# Patient Record
Sex: Female | Born: 1985 | Race: White | Hispanic: No | Marital: Single | Smoking: Current every day smoker
Health system: Southern US, Community
[De-identification: ages and names within clinical notes are randomized; demographics above are authoritative.]

## PROBLEM LIST (undated history)

## (undated) DIAGNOSIS — B1920 Unspecified viral hepatitis C without hepatic coma: Secondary | ICD-10-CM

## (undated) DIAGNOSIS — F429 Obsessive-compulsive disorder, unspecified: Secondary | ICD-10-CM

---

## 2012-12-09 DIAGNOSIS — F39 Unspecified mood [affective] disorder: Secondary | ICD-10-CM | POA: Insufficient documentation

## 2015-08-02 ENCOUNTER — Emergency Department (HOSPITAL_BASED_OUTPATIENT_CLINIC_OR_DEPARTMENT_OTHER): Payer: Self-pay

## 2015-08-02 ENCOUNTER — Encounter (HOSPITAL_BASED_OUTPATIENT_CLINIC_OR_DEPARTMENT_OTHER): Payer: Self-pay

## 2015-08-02 ENCOUNTER — Emergency Department (HOSPITAL_BASED_OUTPATIENT_CLINIC_OR_DEPARTMENT_OTHER)
Admission: EM | Admit: 2015-08-02 | Discharge: 2015-08-02 | Disposition: A | Discharge status: Home / Self Care | Payer: Self-pay | Attending: Emergency Medicine | Admitting: Emergency Medicine

## 2015-08-02 DIAGNOSIS — K5900 Constipation, unspecified: Secondary | ICD-10-CM | POA: Insufficient documentation

## 2015-08-02 DIAGNOSIS — F17200 Nicotine dependence, unspecified, uncomplicated: Secondary | ICD-10-CM | POA: Insufficient documentation

## 2015-08-02 HISTORY — DX: Unspecified viral hepatitis C without hepatic coma: B19.20

## 2015-08-02 LAB — PREGNANCY, URINE: Preg Test, Ur: NEGATIVE

## 2015-08-02 MED ORDER — POLYETHYLENE GLYCOL 3350 17 G PO PACK: 17.0000 g | PACK | Freq: Two times a day (BID) | ORAL | Status: DC

## 2015-08-02 MED ORDER — SENNOSIDES 15 MG PO CHEW: 2.0000 | CHEWABLE_TABLET | Freq: Every day | ORAL | Status: DC

## 2015-08-02 NOTE — ED Provider Notes (Signed)
CSN: [id number]     Arrival date & time 08/02/15  1707 History   First MD Initiated Contact with Patient 08/02/15 1723     Chief Complaint  Patient presents with  . Constipation      Patient is a 30 y.o. female presenting with constipation. The history is provided by the patient.  Constipation Severity:  Moderate Associated symptoms: abdominal pain and vomiting   Associated symptoms: no back pain, no diarrhea, no fever and no nausea   Patient presents with constipation for a week. States it hurts in her lower abdomen. 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 States that whenever she withdrawals she tends to get constipated. States she gets "compacted". No fevers. States she also was nauseous. No dysuria. Denies possibly pregnancy and states her tubes have been cut out. States she has had miralax and other laxatives without relief. States she's had magnesium citrate 3 times. States she is at [facility] and they do not allow her to have her regular routine. She states her abdomen is distended she's put on 20 pounds.   Past Medical History  Diagnosis Date  . Hepatitis C    History reviewed. No pertinent past surgical history. No family history on file. Social History  Substance Use Topics  . Smoking status: Current Every Day Smoker  . Smokeless tobacco: None  . Alcohol Use: None   OB History    No data available     Review of Systems  Constitutional: Negative for fever, chills, activity change and appetite change.  Eyes: Negative for pain.  Respiratory: Negative for chest tightness and shortness of breath.   Cardiovascular: Negative for chest pain and leg swelling.  Gastrointestinal: Positive for vomiting, abdominal pain and constipation. Negative for nausea and diarrhea.  Genitourinary: Negative for flank pain.  Musculoskeletal: Negative for back pain and neck stiffness.  Skin: Negative for rash.  Neurological: Negative for weakness,  numbness and headaches.  Psychiatric/Behavioral: Negative for behavioral problems.      Allergies  Review of patient's allergies indicates no known allergies.  Home Medications   Prior to Admission medications   Medication Sig Start Date End Date Taking? Authorizing Provider  polyethylene glycol (MIRALAX) packet Take 17 g by mouth 2 (two) times daily. 08/02/15   Benjiman CoreNathan Rishik Tubby, MD  Sennosides 15 MG CHEW Chew 2 tablets (30 mg total) by mouth daily. Until bowel movement 08/02/15   Benjiman CoreNathan Tyrone Balash, MD   BP 119/71 mmHg  Pulse 68  Temp(Src) 98 F (36.7 C) (Oral)  Resp 18  Ht 5\' 8"  (1.727 m)  Wt 191 lb (86.637 kg)  BMI 29.05 kg/m2  SpO2 100%  LMP 07/12/2015 Physical Exam  Constitutional: She appears well-nourished.  HENT:  Head: Atraumatic.  Neck: Neck supple.  Cardiovascular: Normal rate.   Pulmonary/Chest: Effort normal.  Abdominal: There is tenderness.  Tenderness to lower abdomen, particularly left lower quadrant without rebound or guarding.  Musculoskeletal: Normal range of motion.  Neurological: She is alert.  Skin: Skin is warm.    ED Course  Procedures (including critical care time) Labs Review Labs Reviewed  PREGNANCY, URINE    Imaging Review Dg Abd 2 Views  08/02/2015  CLINICAL DATA:  Constipation x 6 days with bloating and lower abdominal pain. Pt unable to remove nipple piercings. Shielded UPREG negative EXAM: ABDOMEN - 2 VIEW COMPARISON:  None. FINDINGS: Visualized lung bases clear. No free air. Small bowel is nondistended. Moderate amount of fecal material throughout  the colon, rectum decompressed. Left pelvic phleboliths. Regional bones unremarkable. IMPRESSION: Nonobstructive bowel gas pattern with moderate colonic fecal material. Electronically Signed   By: Corlis Leak M.D.   On: 08/02/2015 18:43   I have personally reviewed and evaluated these images and lab results as part of my medical decision-making.   EKG Interpretation None      MDM    Final diagnoses:  Constipation    Patient with likely constipation. History of same. X-ray shows constipation. Will discharge with some medicines to help clear up.    Benjiman Core, MD 08/02/15 2257

## 2015-08-02 NOTE — ED Notes (Signed)
Pt reports last BM one week ago. Pt states [facility] is [facility]. Pt states she normally experiences constipation during detox and [facility] will not allow her to take stool softeners without a rx. Pt reports low mid abd pain, denies N/V

## 2015-08-02 NOTE — ED Notes (Signed)
MD at bedside. 

## 2015-08-02 NOTE — ED Notes (Signed)
Patient here with constipation x 1 week. Has had mag citrate x 3 bottles this week at [facility] with no relief, hx of same. Nausea with any solid intake. They have given her miralax as well

## 2015-08-02 NOTE — Discharge Instructions (Signed)

## 2015-08-27 ENCOUNTER — Emergency Department (HOSPITAL_BASED_OUTPATIENT_CLINIC_OR_DEPARTMENT_OTHER)
Admission: EM | Admit: 2015-08-27 | Discharge: 2015-08-27 | Disposition: A | Discharge status: Home / Self Care | Payer: Self-pay | Attending: Emergency Medicine | Admitting: Emergency Medicine

## 2015-08-27 ENCOUNTER — Encounter (HOSPITAL_BASED_OUTPATIENT_CLINIC_OR_DEPARTMENT_OTHER): Payer: Self-pay | Admitting: *Deleted

## 2015-08-27 DIAGNOSIS — F17200 Nicotine dependence, unspecified, uncomplicated: Secondary | ICD-10-CM | POA: Insufficient documentation

## 2015-08-27 DIAGNOSIS — Z79899 Other long term (current) drug therapy: Secondary | ICD-10-CM | POA: Insufficient documentation

## 2015-08-27 DIAGNOSIS — L409 Psoriasis, unspecified: Secondary | ICD-10-CM | POA: Insufficient documentation

## 2015-08-27 LAB — PREGNANCY, URINE: PREG TEST UR: NEGATIVE

## 2015-08-27 MED ORDER — PREDNISONE 50 MG PO TABS
60.0000 mg | ORAL_TABLET | Freq: Once | ORAL | Status: AC
  Administered 2015-08-27: 60 mg via ORAL
  Filled 2015-08-27: qty 1

## 2015-08-27 MED ORDER — LORATADINE 10 MG PO TABS
10.0000 mg | ORAL_TABLET | Freq: Once | ORAL | Status: AC
  Administered 2015-08-27: 10 mg via ORAL
  Filled 2015-08-27: qty 1

## 2015-08-27 MED ORDER — PREDNISONE 20 MG PO TABS: ORAL_TABLET | ORAL | Status: DC

## 2015-08-27 MED ORDER — LORATADINE 10 MG PO TABS: 10.0000 mg | ORAL_TABLET | Freq: Every day | ORAL | Status: DC

## 2015-08-27 NOTE — Discharge Instructions (Signed)
Psoriasis Psoriasis is a long-term (chronic) condition of skin inflammation. It occurs because your immune system causes skin cells to form too quickly. As a result, too many skin cells grow and create raised, red patches (plaques) that look silvery on your skin. Plaques may appear anywhere on your body. They can be any size or shape. Psoriasis can come and go. The condition varies from mild to very severe. It cannot be passed from one person to another (not contagious).  CAUSES  The cause of psoriasis is not known, but certain factors can make the condition worse. These include:   Damage or trauma to the skin, such as cuts, scrapes, sunburn, and dryness.  Lack of sunlight.  Certain medicines.  Alcohol.  Tobacco use.  Stress.  Infections caused by bacteria or viruses. RISK FACTORS This condition is more likely to develop in:  People with a family history of psoriasis.  People who are Caucasian.  People who are between the ages of 15-30 and 50-60 years old. SYMPTOMS  There are five different types of psoriasis. You can have more than one type of psoriasis during your life. Types are:   Plaque.  Guttate.  Inverse.  Pustular.  Erythrodermic. Each type of psoriasis has different symptoms.   Plaque psoriasis symptoms include red, raised plaques with a silvery white coating (scale). These plaques may be itchy. Your nails may be pitted and crumbly or fall off.  Guttate psoriasis symptoms include small red spots that often show up on your trunk, arms, and legs. These spots may develop after you have been sick, especially with strep throat.  Inverse psoriasis symptoms include plaques in your underarm area, under your breasts, or on your genitals, groin, or buttocks.  Pustular psoriasis symptoms include pus-filled bumps that are painful, red, and swollen on the palms of your hands or the soles of your feet. You also may feel exhausted, feverish, weak, or have no  appetite.  Erythrodermic psoriasis symptoms include bright red skin that may look burned. You may have a fast heartbeat and a body temperature that is too high or too low. You may be itchy or in pain. DIAGNOSIS  Your health care provider may suspect psoriasis based on your symptoms and family history. Your health care provider will also do a physical exam. This may include a procedure to remove a tissue sample (biopsy) for testing. You may also be referred to a health care provider who specializes in skin diseases (dermatologist).  TREATMENT There is no cure for this condition, but treatment can help manage it. Goals of treatment include:   Helping your skin heal.  Reducing itching and inflammation.  Slowing the growth of new skin cells.  Helping your immune system respond better to your skin. Treatment varies, depending on the severity of your condition. Treatment may include:   Creams or ointments.  Ultraviolet ray exposure (light therapy). This may include natural sunlight or light therapy in a medical office.  Medicines (systemic therapy). These medicines can help your body better manage skin cell turnover and inflammation. They may be used along with light therapy or ointments. You may also get antibiotic medicines if you have an infection. HOME CARE INSTRUCTIONS Skin Care  Moisturize your skin as needed. Only use moisturizers that have been approved by your health care provider.   Apply cool compresses to the affected areas.   Do not scratch your skin.  Lifestyle  Do not use tobacco products. This includes cigarettes, chewing tobacco, and e-cigarettes. If you   need help quitting, ask your health care provider.  Drink little or no alcohol.   Try techniques for stress reduction, such as meditation or yoga.  Get exposure to the sun as told by your health care provider. Do not get sunburned.   Consider joining a psoriasis support group.  Medicines  Take or use  over-the-counter and prescription medicines only as told by your health care provider.  If you were prescribed an antibiotic, take or use it as told by your health care provider. Do not stop taking the antibiotic even if your condition starts to improve. General Instructions  Keep a journal to help track what triggers an outbreak. Try to avoid any triggers.   See a counselor or social worker if feelings of sadness, frustration, and hopelessness about your condition are interfering with your work and relationships.  Keep all follow-up visits as told by your health care provider. This is important. SEEK MEDICAL CARE IF:  Your pain gets worse.  You have increasing redness or warmth in the affected areas.   You have new or worsening pain or stiffness in your joints.  Your nails start to break easily or pull away from the nail bed.   You have a fever.   You feel depressed.   This information is not intended to replace advice given to you by your health care provider. Make sure you discuss any questions you have with your health care provider.   Document Released: 01/26/2000 Document Revised: 10/19/2014 Document Reviewed: 06/15/2014 Elsevier Interactive Patient Education 2016 [employer]  

## 2015-08-27 NOTE — ED Provider Notes (Signed)
CSN: [id number]     Arrival date & time 08/27/15  2054 History  By signing my name below, I, Levon Hedger, attest that this documentation has been prepared under the direction and in the presence of No att. providers found . Electronically Signed: Levon Hedger, Scribe. 08/27/2015. 10:57 PM.   Chief Complaint  Patient presents with  . Allergic Reaction    Patient is a 30 y.o. female presenting with rash. The history is provided by the patient. No language interpreter was used.  Rash Location:  Head/neck and face Head/neck rash location:  Scalp Facial rash location:  Face Quality: itchiness   Quality: not draining   Severity:  Mild Onset quality:  Gradual Timing:  Constant Progression:  Worsening Chronicity:  Recurrent Context: not hot tub use   Relieved by:  Nothing Worsened by:  Nothing tried Ineffective treatments:  None tried Associated symptoms: no fever, no induration, no throat swelling and no tongue swelling     HPI Comments:  Leslie Dyer is a 30 y.o. female with PMHx of OCD who presents to the Emergency Department complaining of sudden onset lesions on her face and scalp on Friday. P tis a [facility] pt. She recently started on prozac and has hx of previous allergic reaction to it. Pt is "a picker" and is picking the blisters. No alleviating factors noted. No other complaints at this time.    Past Medical History  Diagnosis Date  . Hepatitis C    History reviewed. No pertinent past surgical history. History reviewed. No pertinent family history. Social History  Substance Use Topics  . Smoking status: Current Every Day Smoker  . Smokeless tobacco: None  . Alcohol Use: No   OB History    No data available     Review of Systems  Constitutional: Negative for fever.  Skin: Positive for rash.  All other systems reviewed and are negative.    Allergies  Review of patient's allergies indicates no known allergies.  Home Medications   Prior to Admission  medications   Medication Sig Start Date End Date Taking? Authorizing Provider  FLUoxetine (PROZAC) 10 MG capsule Take 10 mg by mouth daily.   Yes Historical Provider, MD  polyethylene glycol (MIRALAX) packet Take 17 g by mouth 2 (two) times daily. 08/02/15   Benjiman Core, MD  Sennosides 15 MG CHEW Chew 2 tablets (30 mg total) by mouth daily. Until bowel movement 08/02/15   Benjiman Core, MD   BP 118/87 mmHg  Pulse 79  Temp(Src) 98.1 F (36.7 C) (Oral)  Resp 18  Ht  (1.753 m)  Wt 197 lb (89.359 kg)  BMI 29.08 kg/m2  SpO2 99%  LMP 07/10/2015 Physical Exam  Constitutional: She is oriented to person, place, and time. She appears well-developed and well-nourished. No distress.  HENT:  Head: Normocephalic and atraumatic.  Mouth/Throat: Oropharynx is clear and moist. No oropharyngeal exudate.  Scabs of the face.  Psoriatic lesions of the scalp  Eyes: Conjunctivae are normal. Pupils are equal, round, and reactive to light.  Neck: Normal range of motion. Neck supple. No JVD present.  Trachea midline No bruit  Cardiovascular: Normal rate, regular rhythm and normal heart sounds.   Pulmonary/Chest: Effort normal and breath sounds normal. No stridor. No respiratory distress. She has no wheezes. She has no rales.  Abdominal: Soft. Bowel sounds are normal. She exhibits no distension. There is no tenderness. There is no rebound and no guarding.  Neurological: She is alert and oriented to person, place,  and time. She has normal reflexes.  Skin: Skin is warm and dry.  Psychiatric: She has a normal mood and affect. Her behavior is normal.  Nursing note and vitals reviewed.   ED Course  Procedures  DIAGNOSTIC STUDIES:  Oxygen Saturation is 99% on RA, normal by my interpretation.    COORDINATION OF CARE:  11:07 PM Will order preDiscussed treatment plan with pt at bedside and pt agreed to plan.  Labs Review Labs Reviewed  PREGNANCY, URINE    Imaging Review No results found. I  have personally reviewed and evaluated these images and lab results as part of my medical decision-making.   EKG Interpretation None      MDM   Final diagnoses:  None   Filed Vitals:   08/27/15 2106 08/27/15 2328  BP: 118/87 120/84  Pulse: 79 78  Temp: 98.1 F (36.7 C) 98.2 F (36.8 C)  Resp: 18 18    The facial lesions are not the same as the scalp lesions.  The scalp lesions are consistent with psoriasis.  It is unclear what the facial lesions were, they are scabs at this point there are 4 lesions approximately 5 mm, no signs of infection.  There is no signs of EM nor SJ.   Neither is from the Prozac.  Follow up.  All questions answered to patient's satisfaction. Based on history and exam patient has been appropriately medically screened and emergency conditions excluded. Patient is stable for discharge at this time. Follow up provided and strict return precautions given  I personally performed the services described in this documentation, which was scribed in my presence. The recorded information has been reviewed and is accurate.       Cy BlamerApril Albertina Leise, MD 08/28/15 [phone]

## 2015-08-27 NOTE — ED Notes (Addendum)
Pt reports that she was started on prozac-hx of allergic reaction to it previously.  Noted to have blisters on her face and head.  Reports itching and burning of blisters.  Pt is a [facility] pt.

## 2015-08-27 NOTE — ED Notes (Signed)
MD at bedside. 

## 2015-08-27 NOTE — ED Notes (Signed)
Pt is a resident of [facility].

## 2015-08-27 NOTE — ED Notes (Signed)
Pt given d/c instructions as per chart. Verbalizes understanding. No questions. Rx x 2. 

## 2016-09-12 ENCOUNTER — Emergency Department (HOSPITAL_COMMUNITY)
Admission: EM | Admit: 2016-09-12 | Discharge: 2016-09-13 | Disposition: A | Discharge status: Home / Self Care | Payer: Self-pay | Attending: Emergency Medicine | Admitting: Emergency Medicine

## 2016-09-12 ENCOUNTER — Encounter (HOSPITAL_COMMUNITY): Payer: Self-pay | Admitting: Emergency Medicine

## 2016-09-12 ENCOUNTER — Emergency Department (HOSPITAL_COMMUNITY): Payer: Self-pay

## 2016-09-12 DIAGNOSIS — B1710 Acute hepatitis C without hepatic coma: Secondary | ICD-10-CM | POA: Insufficient documentation

## 2016-09-12 DIAGNOSIS — J209 Acute bronchitis, unspecified: Secondary | ICD-10-CM | POA: Insufficient documentation

## 2016-09-12 DIAGNOSIS — L02811 Cutaneous abscess of head [any part, except face]: Secondary | ICD-10-CM | POA: Insufficient documentation

## 2016-09-12 DIAGNOSIS — F429 Obsessive-compulsive disorder, unspecified: Secondary | ICD-10-CM | POA: Insufficient documentation

## 2016-09-12 DIAGNOSIS — F17200 Nicotine dependence, unspecified, uncomplicated: Secondary | ICD-10-CM | POA: Insufficient documentation

## 2016-09-12 DIAGNOSIS — L03811 Cellulitis of head [any part, except face]: Secondary | ICD-10-CM | POA: Insufficient documentation

## 2016-09-12 DIAGNOSIS — Z79899 Other long term (current) drug therapy: Secondary | ICD-10-CM | POA: Insufficient documentation

## 2016-09-12 HISTORY — DX: Obsessive-compulsive disorder, unspecified: F42.9

## 2016-09-12 LAB — COMPREHENSIVE METABOLIC PANEL
ALK PHOS: 49 U/L (ref 38–126)
ALT: 13 U/L — ABNORMAL LOW (ref 14–54)
ANION GAP: 6 (ref 5–15)
AST: 29 U/L (ref 15–41)
Albumin: 3.4 g/dL — ABNORMAL LOW (ref 3.5–5.0)
BILIRUBIN TOTAL: 0.7 mg/dL (ref 0.3–1.2)
BUN: 10 mg/dL (ref 6–20)
CALCIUM: 9 mg/dL (ref 8.9–10.3)
CO2: 29 mmol/L (ref 22–32)
Chloride: 105 mmol/L (ref 101–111)
Creatinine, Ser: 0.81 mg/dL (ref 0.44–1.00)
Glucose, Bld: 105 mg/dL — ABNORMAL HIGH (ref 65–99)
POTASSIUM: 3.8 mmol/L (ref 3.5–5.1)
Sodium: 140 mmol/L (ref 135–145)
TOTAL PROTEIN: 7.1 g/dL (ref 6.5–8.1)

## 2016-09-12 LAB — CBC WITH DIFFERENTIAL/PLATELET
BASOS ABS: 0 10*3/uL (ref 0.0–0.1)
Basophils Relative: 0 %
Eosinophils Absolute: 0.3 10*3/uL (ref 0.0–0.7)
Eosinophils Relative: 4 %
HEMATOCRIT: 35.9 % — AB (ref 36.0–46.0)
HEMOGLOBIN: 12 g/dL (ref 12.0–15.0)
LYMPHS PCT: 46 %
Lymphs Abs: 3.6 10*3/uL (ref 0.7–4.0)
MCH: 29.9 pg (ref 26.0–34.0)
MCHC: 33.4 g/dL (ref 30.0–36.0)
MCV: 89.3 fL (ref 78.0–100.0)
MONO ABS: 0.5 10*3/uL (ref 0.1–1.0)
Monocytes Relative: 6 %
Neutro Abs: 3.5 10*3/uL (ref 1.7–7.7)
Neutrophils Relative %: 44 %
Platelets: 198 10*3/uL (ref 150–400)
RBC: 4.02 MIL/uL (ref 3.87–5.11)
RDW: 13.4 % (ref 11.5–15.5)
WBC: 8 10*3/uL (ref 4.0–10.5)

## 2016-09-12 LAB — I-STAT CG4 LACTIC ACID, ED
Lactic Acid, Venous: 0.49 mmol/L — ABNORMAL LOW (ref 0.5–1.9)
Lactic Acid, Venous: 0.84 mmol/L (ref 0.5–1.9)

## 2016-09-12 MED ORDER — CLINDAMYCIN HCL 150 MG PO CAPS: 450.0000 mg | ORAL_CAPSULE | Freq: Three times a day (TID) | ORAL | 0 refills | Status: DC

## 2016-09-12 MED ORDER — LIDOCAINE HCL (PF) 1 % IJ SOLN
5.0000 mL | Freq: Once | INTRAMUSCULAR | Status: AC
  Administered 2016-09-12: 5 mL via INTRADERMAL
  Filled 2016-09-12: qty 5

## 2016-09-12 MED ORDER — HYDROMORPHONE HCL 1 MG/ML IJ SOLN
1.0000 mg | Freq: Once | INTRAMUSCULAR | Status: AC
  Administered 2016-09-12: 1 mg via INTRAMUSCULAR
  Filled 2016-09-12: qty 1

## 2016-09-12 MED ORDER — CLINDAMYCIN HCL 150 MG PO CAPS
300.0000 mg | ORAL_CAPSULE | Freq: Once | ORAL | Status: AC
  Administered 2016-09-12: 300 mg via ORAL
  Filled 2016-09-12: qty 2

## 2016-09-12 MED ORDER — IBUPROFEN 800 MG PO TABS: 800.0000 mg | ORAL_TABLET | Freq: Three times a day (TID) | ORAL | 0 refills | Status: DC

## 2016-09-12 NOTE — Discharge Instructions (Signed)
°  Take your antibiotics as directed and to completion. You should never have any leftover antibiotics! Push fluids and stay well hydrated.  ° °Any antibiotic use can reduce the efficacy of hormonal birth control. Please use back up method of contraception.  ° °Do not hesitate to return to the emergency room for any new, worsening or concerning symptoms. ° °Please obtain primary care using resource guide below. Let them know that you were seen in the emergency room and that they will need to obtain records for further outpatient management. ° ° ° °

## 2016-09-12 NOTE — ED Triage Notes (Signed)
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 She has OCD picking disorder and has picked away the scabs and feels it is infected. Pt also had a tick bite on her right temple. She states she did not get the head out and now has red bump on side of her head and swelling, pus. Pt states she has felt "sick" ever since. Also admits to medicating with heroine to help the pain.

## 2016-09-12 NOTE — ED Provider Notes (Signed)
Patient was initially triaged and scheduled to return Bactrim. Patient never came cerumen was removed from the transport. I did not participate in the care of this patient.   Maxwell CaulLayden, Lindsey A, PA-C 09/12/16 2022    Jacalyn LefevreHaviland, Julie, MD 09/12/16 2110

## 2016-09-12 NOTE — ED Provider Notes (Signed)
[facility]-EMERGENCY DEPT Provider Note   CSN: [id number] Arrival date & time: 09/12/16  1701     History   Chief Complaint Chief Complaint  Patient presents with  . Poison Ivy  . Tick Removal    HPI   Blood pressure 120/86, pulse 74, temperature 97.9 F (36.6 C), temperature source Oral, resp. rate 18, last menstrual period 08/13/2016, SpO2 100 %.  Leslie Dyer is a 31 y.o. female with past medical history significant for IV heroin use and hepatitis C complaining of painful lesion to right scalp over her right ear she states that she thinks it was initially a tick bite she cut the area with a razor blade today and there was a significant amount of purulent drainage from the area.  She is reporting that she thinks that she has poison ivy to her buttocks, she's been picking at the area because she has OCD and feel like they're infected. She reports tactile fever no chills, nausea vomiting, chest pain or abdominal pain. States that last tetanus shot was within the last 2-3 years. Patient is also reporting productive cough with pleuritic chest pain over the course last 2 days. She denies shortness of breath, history of DVT/PE, recent immobilizations, calf pain, hormonal birth control.   Past Medical History:  Diagnosis Date  . Hepatitis C   . OCD (obsessive compulsive disorder)     There are no active problems to display for this patient.  History reviewed. No pertinent surgical history.  OB History    No data available       Home Medications    Prior to Admission medications   Medication Sig Start Date End Date Taking? Authorizing Provider  clindamycin (CLEOCIN) 150 MG capsule Take 3 capsules (450 mg total) by mouth 3 (three) times daily. 09/12/16   Jamell Laymon, Joni ReiningNicole, PA-C  FLUoxetine (PROZAC) 10 MG capsule Take 10 mg by mouth daily.    [provider]  ibuprofen (ADVIL,MOTRIN) 800 MG tablet Take 1 tablet (800 mg total) by mouth 3 (three) times daily. 09/12/16    Javeion Cannedy, Joni ReiningNicole, PA-C  loratadine (CLARITIN) 10 MG tablet Take 1 tablet (10 mg total) by mouth daily. 08/27/15   Palumbo, April, MD  polyethylene glycol [facility](MIRALAX) packet Take 17 g by mouth 2 (two) times daily. 08/02/15   Benjiman CorePickering, Nathan, MD  predniSONE (DELTASONE) 20 MG tablet 3 tabs po day one, then 2 po daily x 4 days 08/27/15   Palumbo, April, MD  Sennosides 15 MG CHEW Chew 2 tablets (30 mg total) by mouth daily. Until bowel movement 08/02/15   Benjiman CorePickering, Nathan, MD    Family History History reviewed. No pertinent family history.  Social History Social History  Substance Use Topics  . Smoking status: Current Every Day Smoker  . Smokeless tobacco: Never Used  . Alcohol use No     Allergies   Patient has no known allergies.   Review of Systems Review of Systems  A complete review of systems was obtained and all systems are negative except as noted in the HPI and PMH.    Physical Exam Updated Vital Signs BP 120/86 (BP Location: Right Arm)   Pulse 74   Temp 97.9 F (36.6 C) (Oral)   Resp 18   LMP 08/13/2016   SpO2 100%   Physical Exam  Constitutional: She is oriented to person, place, and time. She appears well-developed and well-nourished. No distress.  HENT:  Head: Normocephalic and atraumatic.  Mouth/Throat: Oropharynx is clear and moist.  Eyes:  Pupils are equal, round, and reactive to light. Conjunctivae and EOM are normal.  Neck: Normal range of motion.  Cardiovascular: Normal rate, regular rhythm and intact distal pulses.   Pulmonary/Chest: Effort normal and breath sounds normal.  Abdominal: Soft. There is no tenderness.  Musculoskeletal: Normal range of motion.  Neurological: She is alert and oriented to person, place, and time.  Skin: She is not diaphoretic.  Multiple scalp wounds to buttocks with no significant cellulitis or purulent drainage or fluctuance.  Right parietal scalp just above the ear with a 1 cm actively draining abscess with 1 cm of  surrounding cellulitis.  Track marks to dorsum's of bilateral hands. No active erythema or significant tenderness to palpation along veins.  Psychiatric: She has a normal mood and affect.  Nursing note and vitals reviewed.    ED Treatments / Results  Labs (all labs ordered are listed, but only abnormal results are displayed) Labs Reviewed  COMPREHENSIVE METABOLIC PANEL - Abnormal; Notable for the following:       Result Value   Glucose, Bld 105 (*)    Albumin 3.4 (*)    ALT 13 (*)    All other components within normal limits  CBC WITH DIFFERENTIAL/PLATELET - Abnormal; Notable for the following:    HCT 35.9 (*)    All other components within normal limits  I-STAT CG4 LACTIC ACID, ED - Abnormal; Notable for the following:    Lactic Acid, Venous 0.49 (*)    All other components within normal limits  URINALYSIS, ROUTINE W REFLEX MICROSCOPIC  I-STAT CG4 LACTIC ACID, ED    EKG  EKG Interpretation None       Radiology Dg Chest 2 View  Result Date: 09/12/2016 CLINICAL DATA:  Cough EXAM: CHEST  2 VIEW COMPARISON:  None. FINDINGS: Lungs are clear.  No pleural effusion or pneumothorax. The heart is normal in size. Visualized osseous structures are within normal limits. IMPRESSION: Normal chest radiographs. Electronically Signed   By: Charline BillsSriyesh  Krishnan M.D.   On: 09/12/2016 18:07    Procedures .Marland Kitchen.Incision and Drainage Date/Time: 09/12/2016 11:31 PM Performed by: Wynetta EmeryPISCIOTTA, Jacobus Colvin Authorized by: Wynetta EmeryPISCIOTTA, Annsleigh Dragoo   Consent:    Consent obtained:  Verbal   Consent given by:  Patient Location:    Type:  Abscess   Location:  Head   Head location:  Scalp Pre-procedure details:    Skin preparation:  Antiseptic wash Anesthesia (see MAR for exact dosages):    Anesthesia method:  None Procedure type:    Complexity:  Simple Procedure details:    Needle aspiration: no     Incision types:  Single straight   Incision depth:  Dermal   Scalpel blade:  11   Wound management:  Probed  and deloculated   Drainage:  Bloody   Drainage amount:  Moderate   Wound treatment:  Wound left open   Packing materials:  None Post-procedure details:    Patient tolerance of procedure:  Tolerated well, no immediate complications    (including critical care time)  Medications Ordered in ED Medications  clindamycin (CLEOCIN) capsule 300 mg (not administered)  HYDROmorphone (DILAUDID) injection 1 mg (1 mg Intramuscular Given 09/12/16 2136)  lidocaine (PF) (XYLOCAINE) 1 % injection 5 mL (5 mLs Intradermal Given 09/12/16 2136)     Initial Impression / Assessment and Plan / ED Course  I have reviewed the triage vital signs and the nursing notes.  Pertinent labs & imaging results that were available during my care of the patient were  reviewed by me and considered in my medical decision making (see chart for details).     Vitals:   09/12/16 1723 09/12/16 1917  BP: (!) 151/104 120/86  Pulse: 74 74  Resp: 16 18  Temp: 97.9 F (36.6 C)   TempSrc: Oral   SpO2: 97% 100%    Medications  clindamycin (CLEOCIN) capsule 300 mg (not administered)  HYDROmorphone (DILAUDID) injection 1 mg (1 mg Intramuscular Given 09/12/16 2136)  lidocaine (PF) (XYLOCAINE) 1 % injection 5 mL (5 mLs Intradermal Given 09/12/16 2136)    Leslie Dyer is 31 y.o. female presenting with Abscess to right parietal area. Tetanus is up-to-date. Vital signs reassuring with no fever, tachycardia. Blood work also reassuring with no significant leukocytosis and normal lactic acid. I and D is performed and patient is started on clindamycin.     Final Clinical Impressions(s) / ED Diagnoses   Final diagnoses:  Cellulitis and abscess of head  Acute bronchitis, unspecified organism    New Prescriptions New Prescriptions   CLINDAMYCIN (CLEOCIN) 150 MG CAPSULE    Take 3 capsules (450 mg total) by mouth 3 (three) times daily.   IBUPROFEN (ADVIL,MOTRIN) 800 MG TABLET    Take 1 tablet (800 mg total) by mouth 3 (three) times  daily.     Kaylyn Lim 09/12/16 2340    Arby Barrette, MD 09/12/16 [phone]

## 2016-09-12 NOTE — ED Notes (Signed)
This RN saw pt walk back into emergency dept

## 2016-09-12 NOTE — ED Notes (Signed)
Called for room, pt did not answer. Pt informed tech first she was going outside to smoke and would be back in a few minutes.

## 2017-01-03 IMAGING — CR DG ABDOMEN 2V
2 series · 2 of 2 positions shown · non-contrast
Comparison: None.

CLINICAL DATA: Constipation x 6 days with bloating and lower
abdominal pain. Pt unable to remove nipple piercings. Shielded UPREG
negative

EXAM:
ABDOMEN - 2 VIEW

[w abdomen upright]
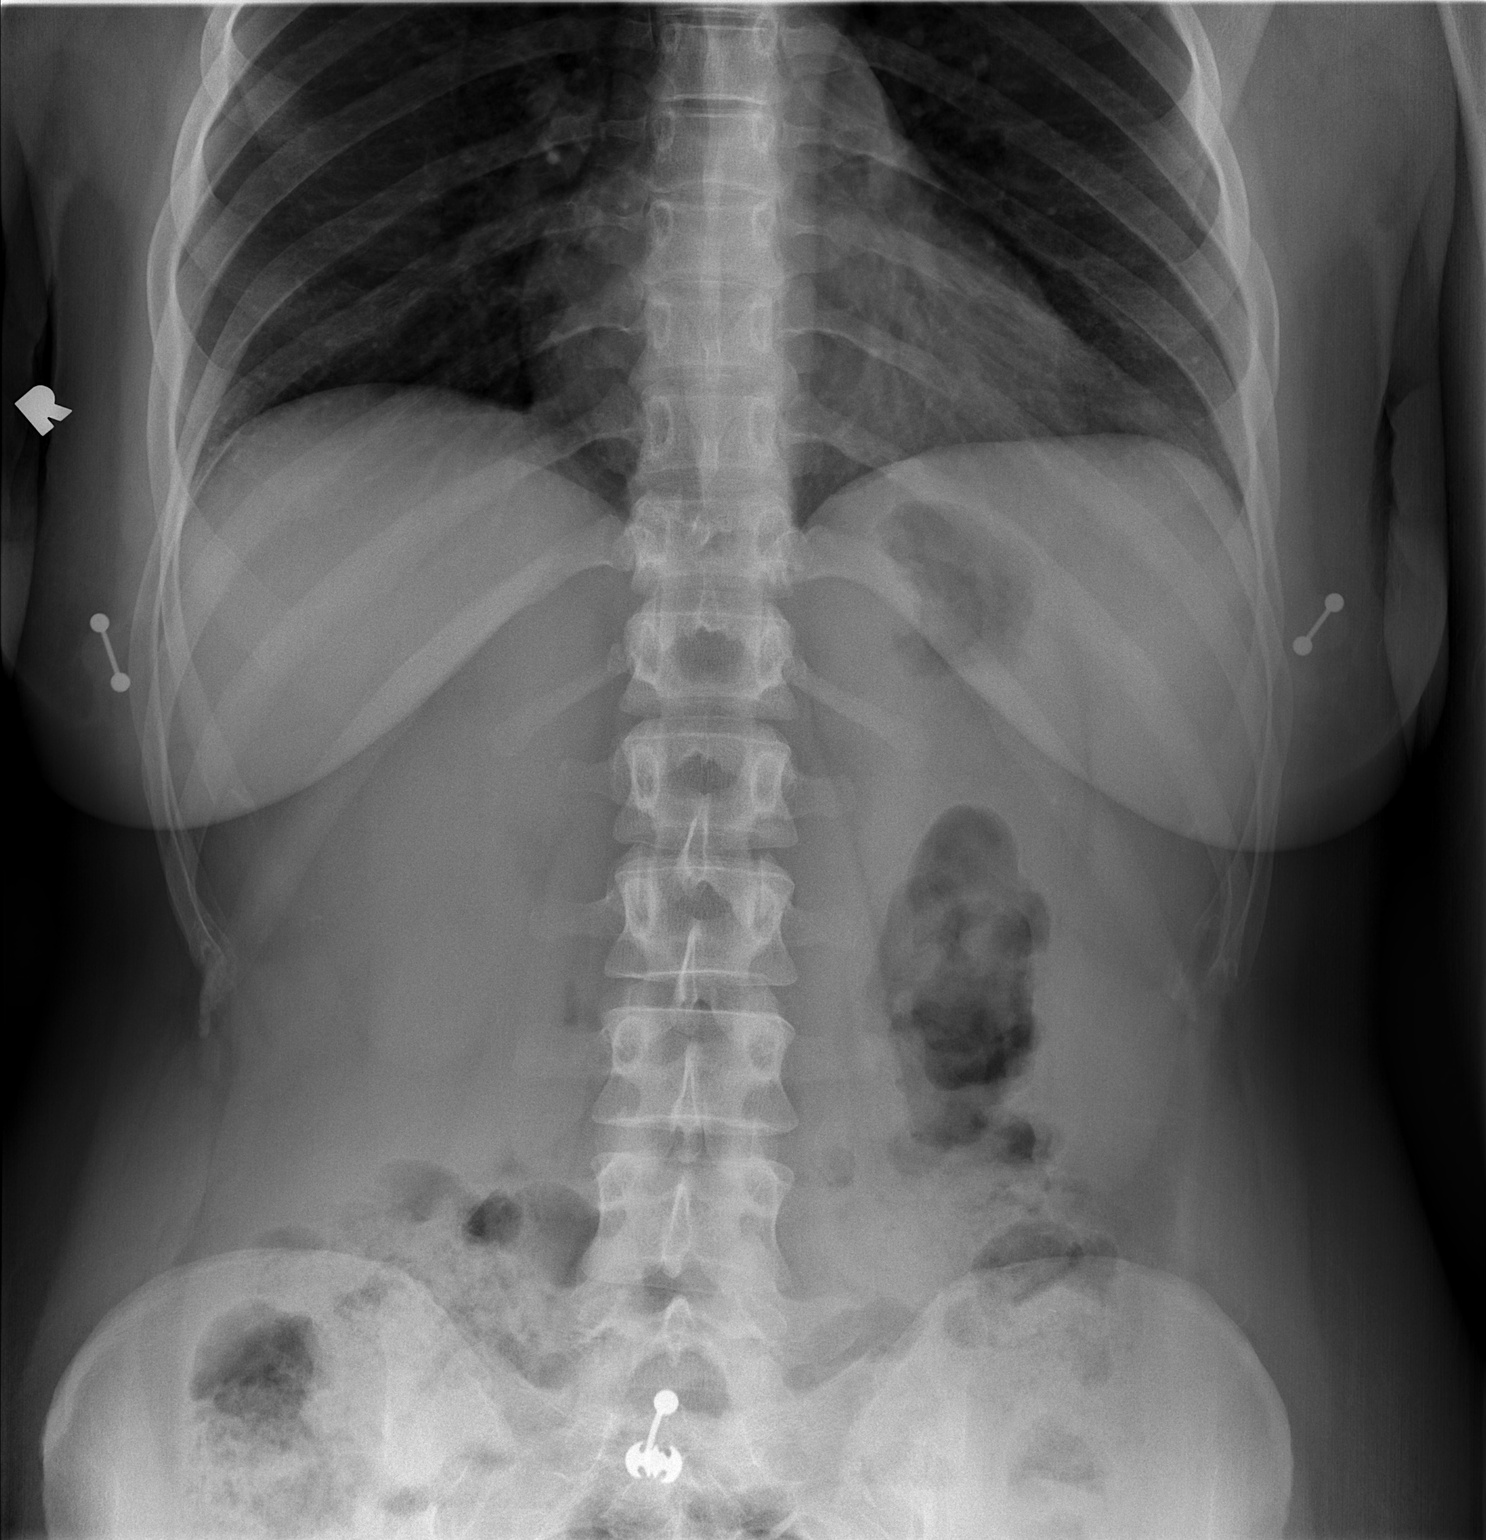

[t abdomen supine]
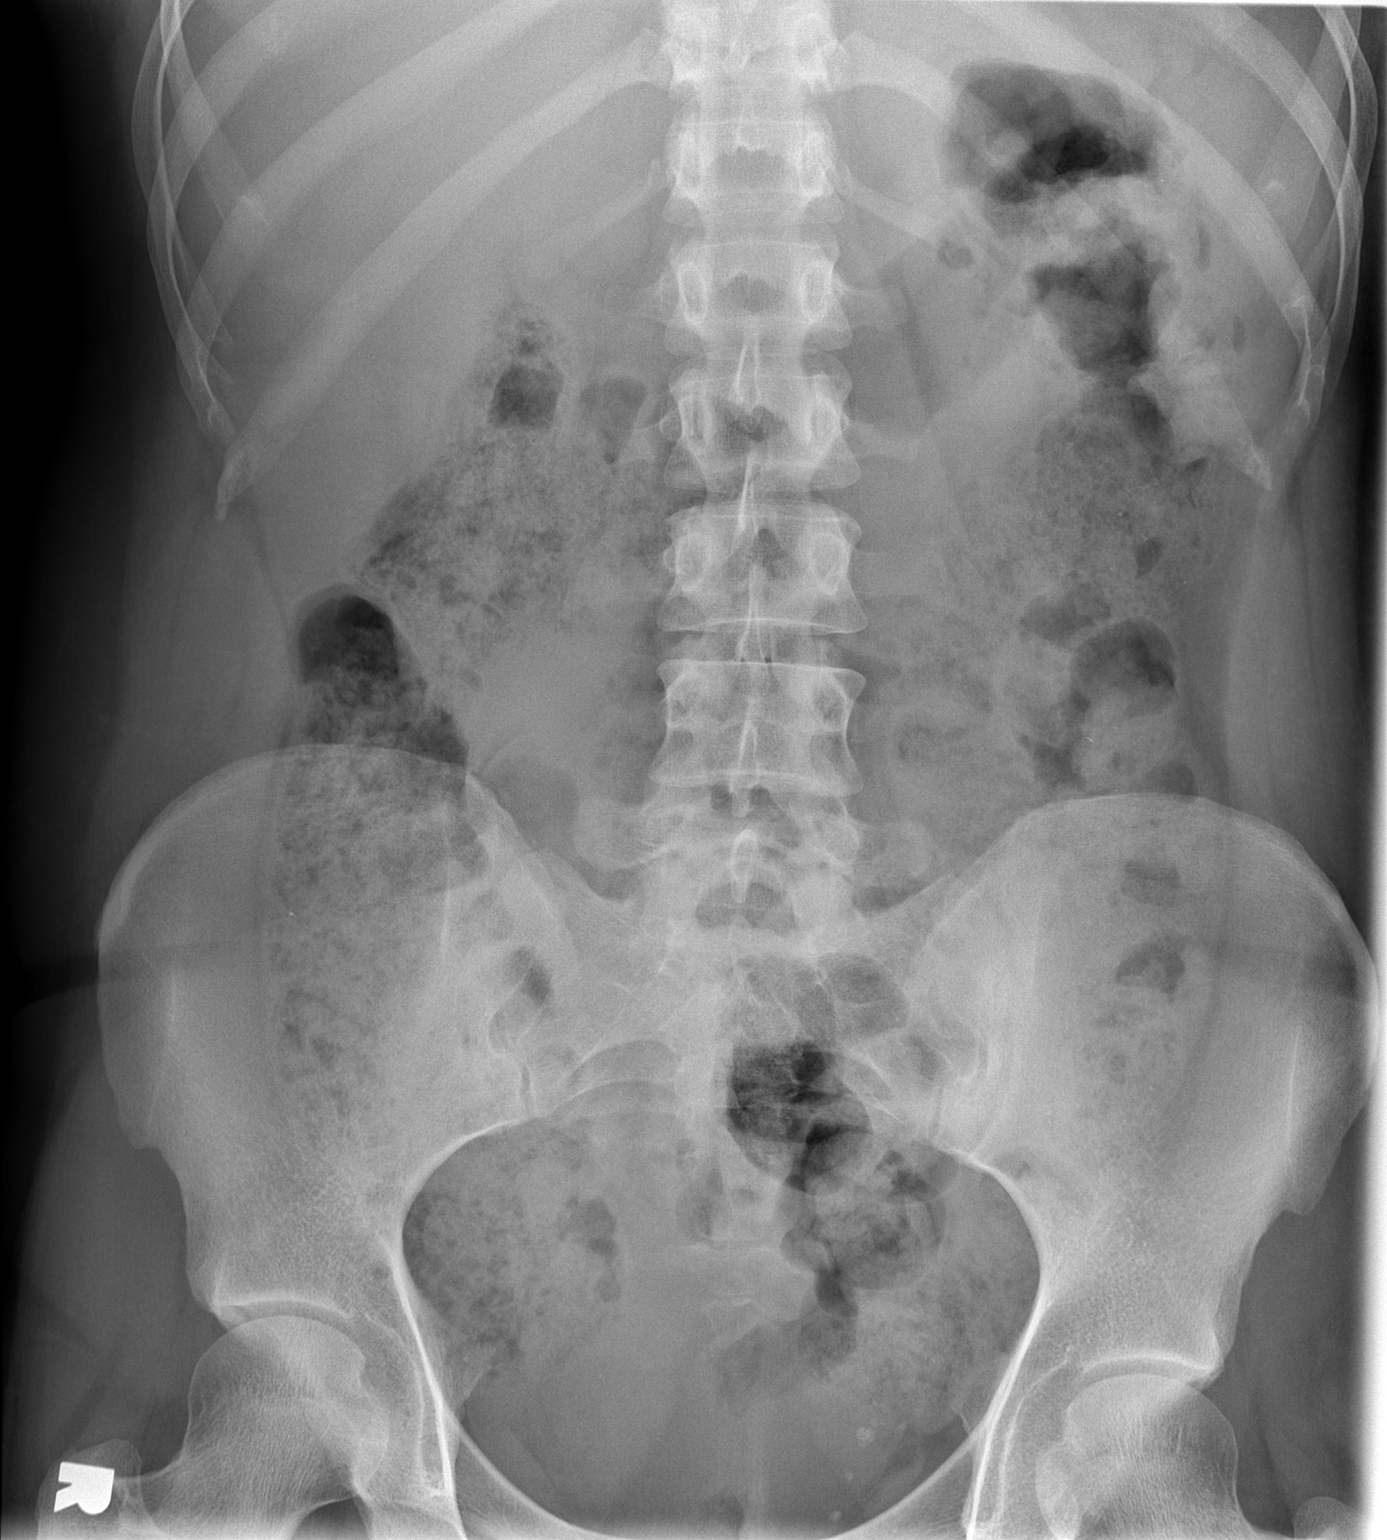

[2 of 2 positions shown; findings below may reference images not displayed]

FINDINGS: Visualized lung bases clear.

No free air.

Small bowel is nondistended. Moderate amount of fecal material
throughout the colon, rectum decompressed.

Left pelvic phleboliths.

Regional bones unremarkable.
IMPRESSION: Nonobstructive bowel gas pattern with moderate colonic fecal
material.

## 2018-02-14 IMAGING — DX DG CHEST 2V
2 series · 2 of 2 positions shown · non-contrast
Comparison: None.

CLINICAL DATA: Cough

EXAM:
CHEST  2 VIEW

[chest pa]
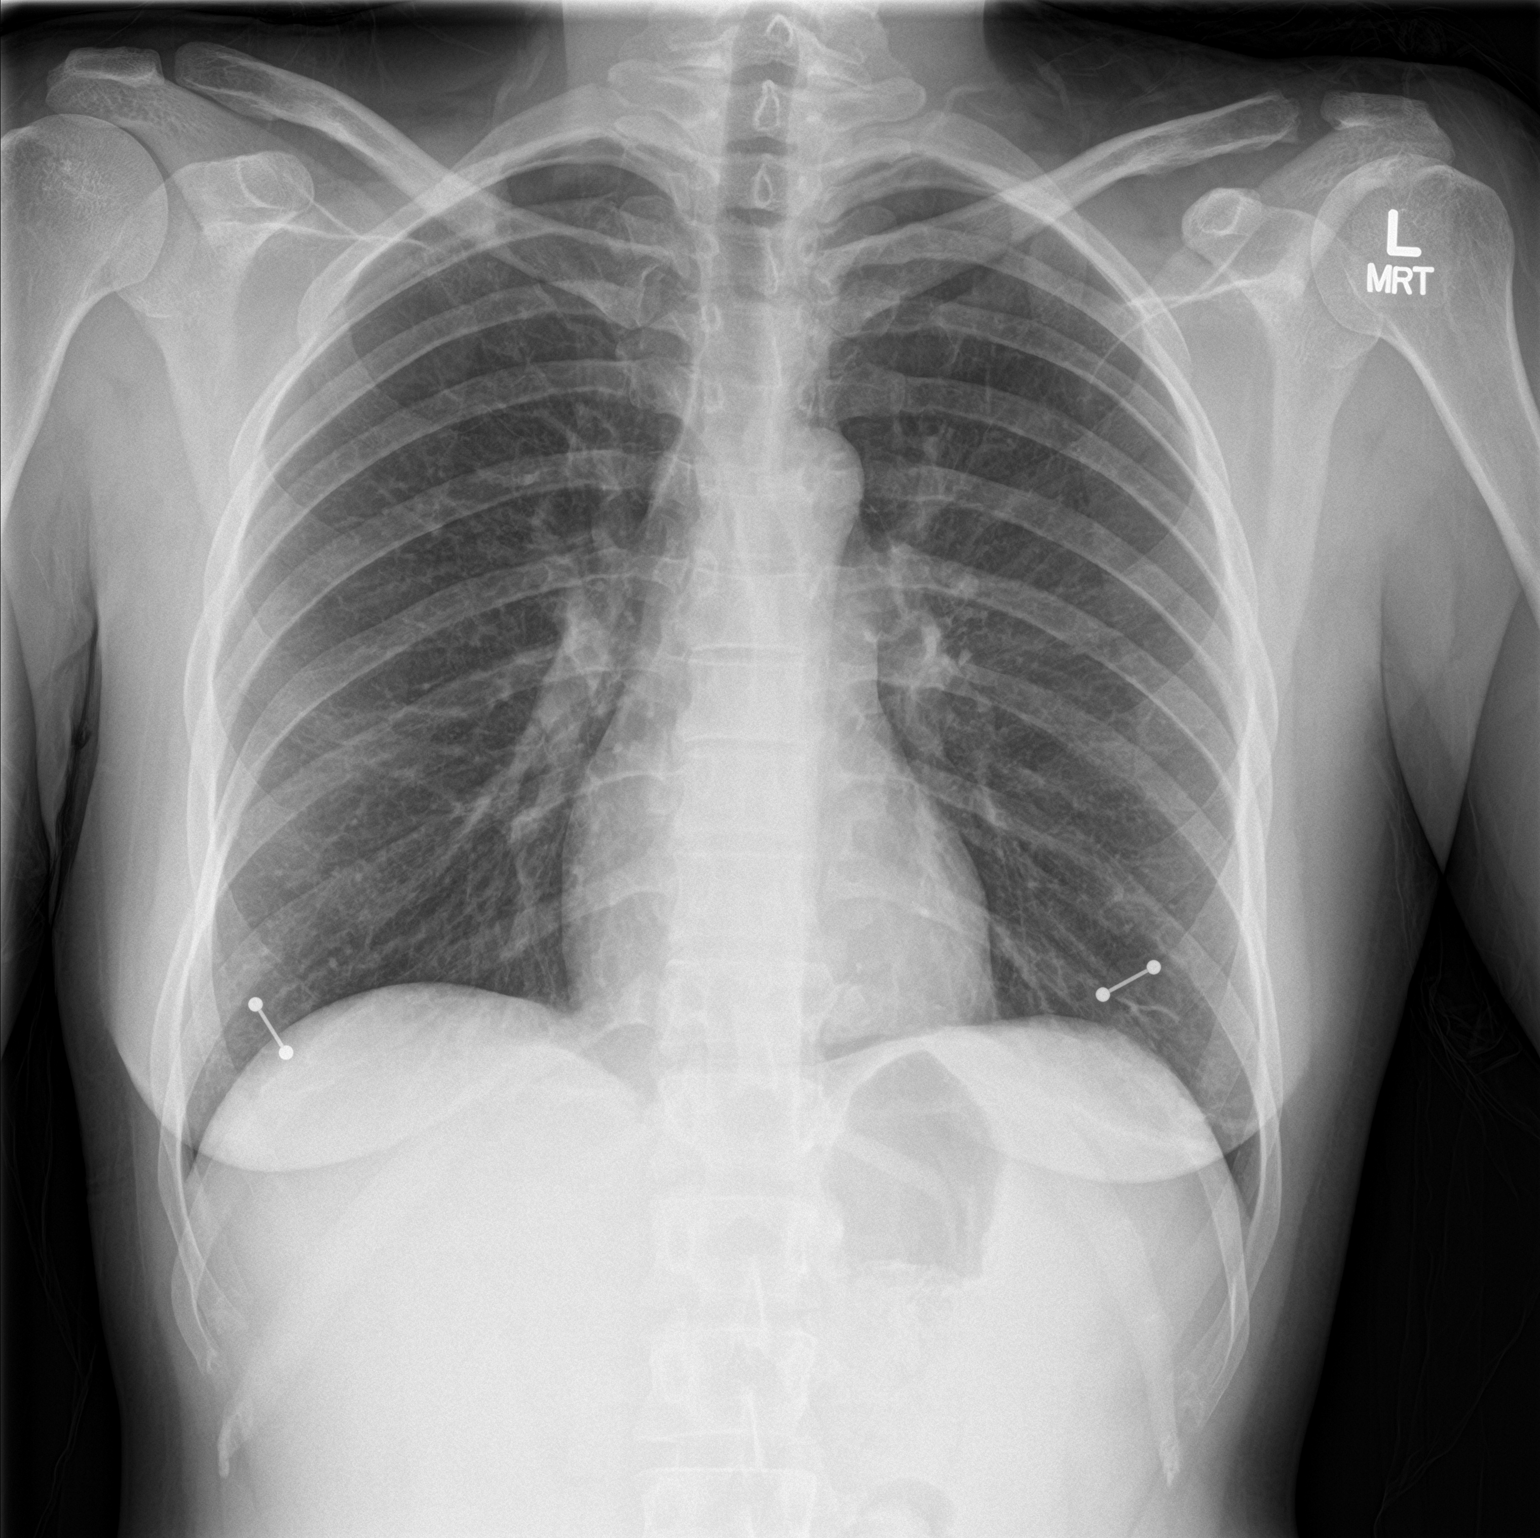

[chest lat]
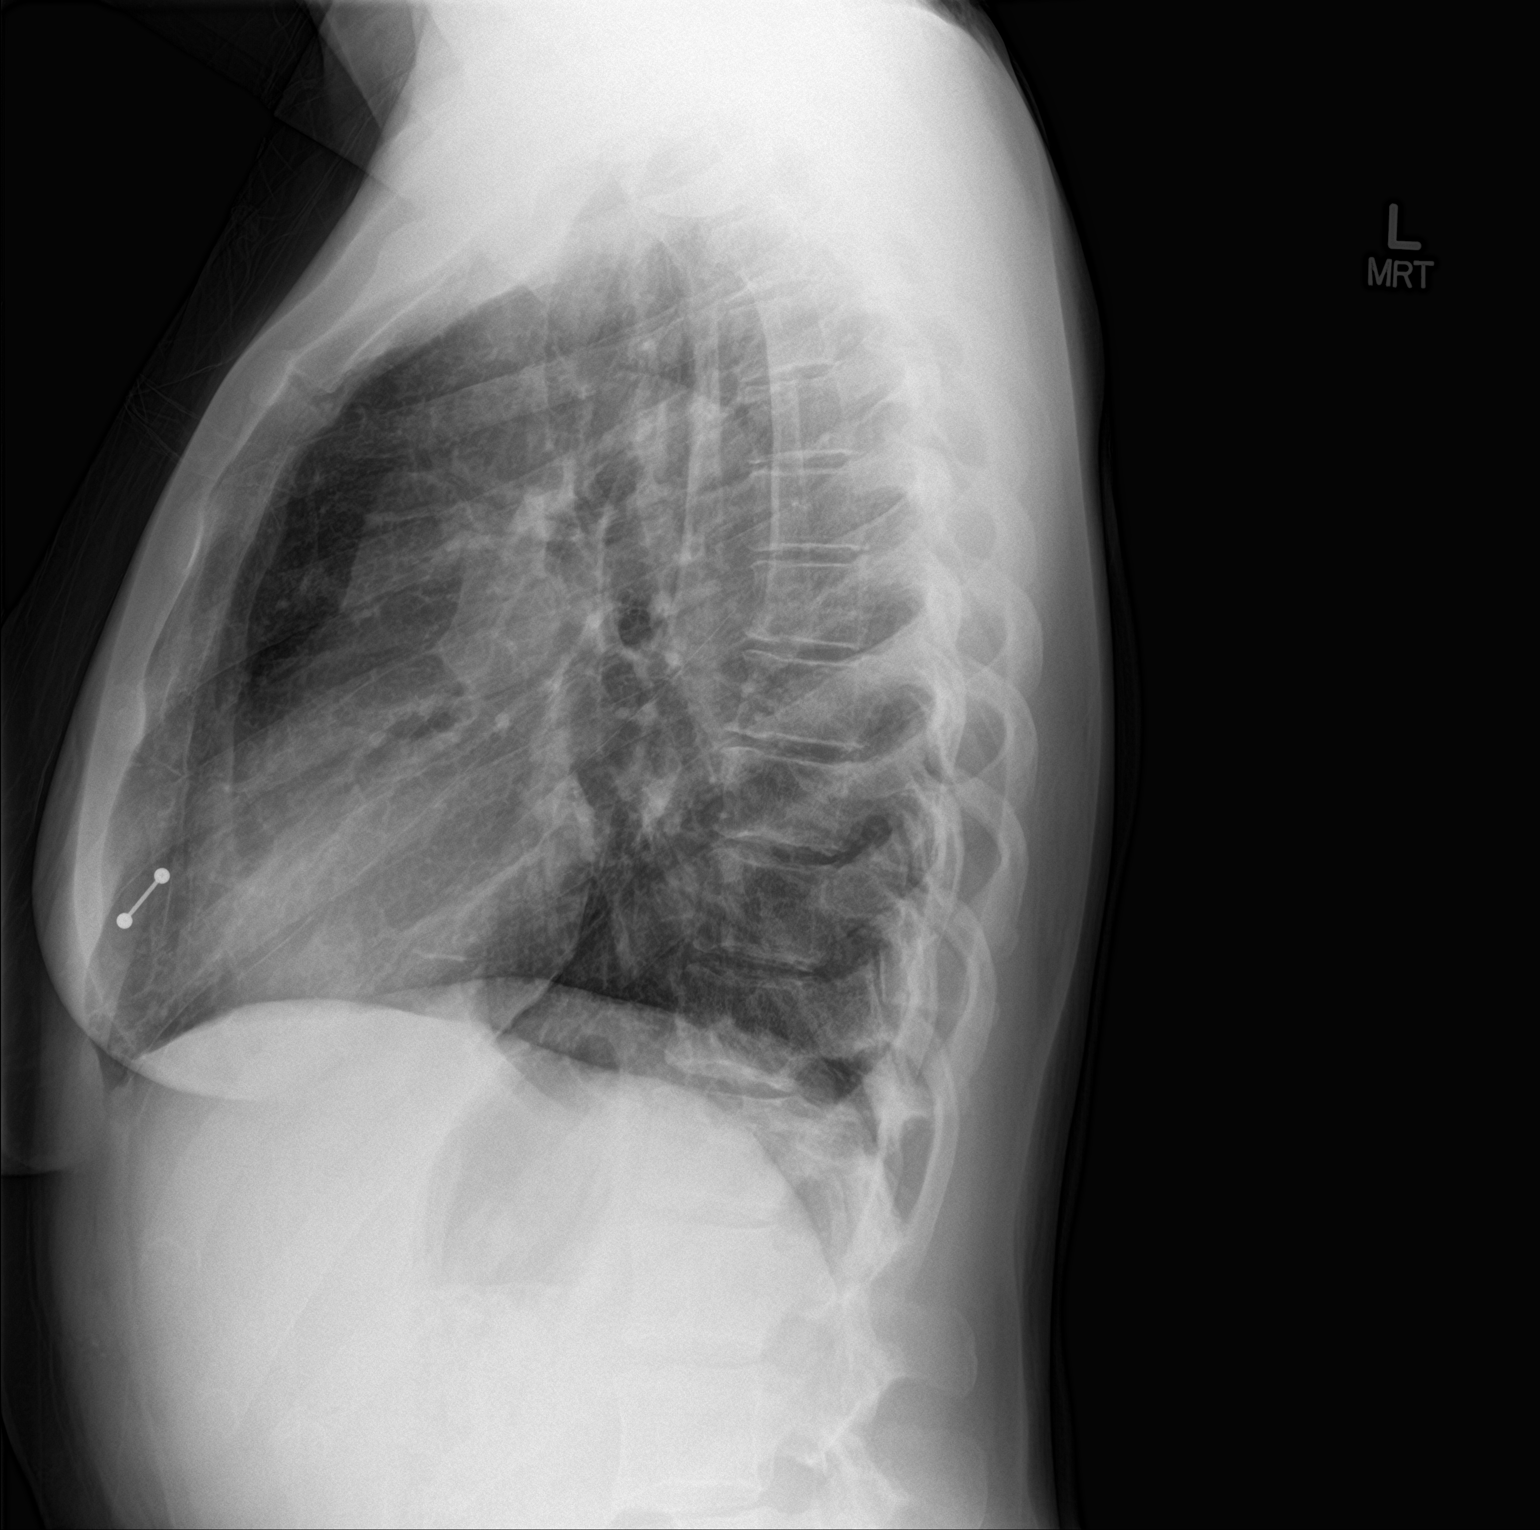

[2 of 2 positions shown; findings below may reference images not displayed]

FINDINGS: Lungs are clear.  No pleural effusion or pneumothorax.

The heart is normal in size.

Visualized osseous structures are within normal limits.
IMPRESSION: Normal chest radiographs.

## 2018-11-07 ENCOUNTER — Emergency Department (HOSPITAL_COMMUNITY): Payer: Self-pay

## 2018-11-07 ENCOUNTER — Emergency Department (HOSPITAL_COMMUNITY)
Admission: EM | Admit: 2018-11-07 | Discharge: 2018-11-08 | Disposition: A | Discharge status: Home / Self Care | Payer: Self-pay | Attending: Emergency Medicine | Admitting: Emergency Medicine

## 2018-11-07 ENCOUNTER — Other Ambulatory Visit: Payer: Self-pay

## 2018-11-07 ENCOUNTER — Encounter (HOSPITAL_COMMUNITY): Payer: Self-pay | Admitting: Emergency Medicine

## 2018-11-07 DIAGNOSIS — Z79899 Other long term (current) drug therapy: Secondary | ICD-10-CM | POA: Insufficient documentation

## 2018-11-07 DIAGNOSIS — F1910 Other psychoactive substance abuse, uncomplicated: Secondary | ICD-10-CM

## 2018-11-07 DIAGNOSIS — R945 Abnormal results of liver function studies: Secondary | ICD-10-CM | POA: Insufficient documentation

## 2018-11-07 DIAGNOSIS — R1033 Periumbilical pain: Secondary | ICD-10-CM

## 2018-11-07 DIAGNOSIS — F17210 Nicotine dependence, cigarettes, uncomplicated: Secondary | ICD-10-CM | POA: Insufficient documentation

## 2018-11-07 DIAGNOSIS — R7989 Other specified abnormal findings of blood chemistry: Secondary | ICD-10-CM

## 2018-11-07 LAB — CBC WITH DIFFERENTIAL/PLATELET
Abs Immature Granulocytes: 0.02 10*3/uL (ref 0.00–0.07)
Basophils Absolute: 0 10*3/uL (ref 0.0–0.1)
Basophils Relative: 0 %
Eosinophils Absolute: 0.2 10*3/uL (ref 0.0–0.5)
Eosinophils Relative: 2 %
HCT: 40.7 % (ref 36.0–46.0)
Hemoglobin: 13.8 g/dL (ref 12.0–15.0)
Immature Granulocytes: 0 %
Lymphocytes Relative: 54 %
Lymphs Abs: 4.3 10*3/uL — ABNORMAL HIGH (ref 0.7–4.0)
MCH: 30.2 pg (ref 26.0–34.0)
MCHC: 33.9 g/dL (ref 30.0–36.0)
MCV: 89.1 fL (ref 80.0–100.0)
Monocytes Absolute: 0.6 10*3/uL (ref 0.1–1.0)
Monocytes Relative: 8 %
Neutro Abs: 2.9 10*3/uL (ref 1.7–7.7)
Neutrophils Relative %: 36 %
Platelets: 222 10*3/uL (ref 150–400)
RBC: 4.57 MIL/uL (ref 3.87–5.11)
RDW: 17.2 % — ABNORMAL HIGH (ref 11.5–15.5)
WBC: 8.1 10*3/uL (ref 4.0–10.5)
nRBC: 0 % (ref 0.0–0.2)

## 2018-11-07 LAB — COMPREHENSIVE METABOLIC PANEL
ALT: 170 U/L — ABNORMAL HIGH (ref 0–44)
AST: 142 U/L — ABNORMAL HIGH (ref 15–41)
Albumin: 3.4 g/dL — ABNORMAL LOW (ref 3.5–5.0)
Alkaline Phosphatase: 181 U/L — ABNORMAL HIGH (ref 38–126)
Anion gap: 9 (ref 5–15)
BUN: 12 mg/dL (ref 6–20)
CO2: 21 mmol/L — ABNORMAL LOW (ref 22–32)
Calcium: 8.9 mg/dL (ref 8.9–10.3)
Chloride: 107 mmol/L (ref 98–111)
Creatinine, Ser: 1.2 mg/dL — ABNORMAL HIGH (ref 0.44–1.00)
GFR calc Af Amer: >60 mL/min (ref >60)
GFR calc non Af Amer: 59 mL/min — ABNORMAL LOW (ref >60)
Glucose, Bld: 88 mg/dL (ref 70–99)
Potassium: 3.7 mmol/L (ref 3.5–5.1)
Sodium: 137 mmol/L (ref 135–145)
Total Bilirubin: 4.4 mg/dL — ABNORMAL HIGH (ref 0.3–1.2)
Total Protein: 8.6 g/dL — ABNORMAL HIGH (ref 6.5–8.1)

## 2018-11-07 LAB — RAPID URINE DRUG SCREEN, HOSP PERFORMED
Amphetamines: POSITIVE — AB
Barbiturates: NOT DETECTED
Benzodiazepines: NOT DETECTED
Cocaine: NOT DETECTED
Opiates: NOT DETECTED
Tetrahydrocannabinol: POSITIVE — AB

## 2018-11-07 LAB — URINALYSIS, ROUTINE W REFLEX MICROSCOPIC
Bacteria, UA: NONE SEEN
Bilirubin Urine: NEGATIVE
Glucose, UA: NEGATIVE mg/dL
Ketones, ur: NEGATIVE mg/dL
Leukocytes,Ua: NEGATIVE
Nitrite: NEGATIVE
Protein, ur: 100 mg/dL — AB
RBC / HPF: >50 RBC/hpf — ABNORMAL HIGH (ref 0–5)
Specific Gravity, Urine: 1.016 (ref 1.005–1.030)
pH: 5 (ref 5.0–8.0)

## 2018-11-07 LAB — SALICYLATE LEVEL: Salicylate Lvl: <7 mg/dL (ref 2.8–30.0)

## 2018-11-07 LAB — POC URINE PREG, ED: Preg Test, Ur: NEGATIVE

## 2018-11-07 LAB — ACETAMINOPHEN LEVEL: Acetaminophen (Tylenol), Serum: <10 ug/mL — ABNORMAL LOW (ref 10–30)

## 2018-11-07 MED ORDER — IOHEXOL 300 MG/ML  SOLN
100.0000 mL | Freq: Once | INTRAMUSCULAR | Status: AC | PRN
  Administered 2018-11-07: 100 mL via INTRAVENOUS

## 2018-11-07 MED ORDER — SODIUM CHLORIDE 0.9 % IV SOLN
INTRAVENOUS | Status: DC
  Administered 2018-11-07: 22:00:00 via INTRAVENOUS

## 2018-11-07 MED ORDER — CLINDAMYCIN PHOSPHATE 600 MG/50ML IV SOLN
600.0000 mg | Freq: Once | INTRAVENOUS | Status: AC
  Administered 2018-11-07: 600 mg via INTRAVENOUS
  Filled 2018-11-07: qty 50

## 2018-11-07 NOTE — ED Notes (Signed)
Pt currently having TTS.  

## 2018-11-07 NOTE — ED Notes (Signed)
Called CT to let them know this pt is ready for CT.

## 2018-11-07 NOTE — ED Notes (Signed)
Pt will be AM psych eval. Asked pt to send home belongings with her father.

## 2018-11-07 NOTE — ED Triage Notes (Signed)
Pt to ED with multi complaints.  Pt st's she is a IV drug addict and has open sores all over her.  Pt st's she has been seen at [facility] x's 3 times and to other hospitals  Pt st's she is currently on antibiotics but can't keep them down.

## 2018-11-07 NOTE — ED Notes (Signed)
Pt states "I am going to my car, I will be right back"  Pt seen walking out of lobby.
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
## 2018-11-07 NOTE — ED Notes (Signed)
Called lab to add-on urine drug screen.

## 2018-11-07 NOTE — ED Notes (Signed)
Patient transported to CT 

## 2018-11-07 NOTE — ED Provider Notes (Signed)
[facility] EMERGENCY DEPARTMENT Provider Note   CSN: [id number] Arrival date & time: 11/07/18  1643     History   Chief Complaint Chief Complaint  Patient presents with   multiple complaints    HPI Leslie Dyer is a 33 y.o. female.     HPI  This patient is a hepatitis C patient who also has a history of polysubstance abuse, obsessive-compulsive disorder and posttraumatic stress disorder.  She has been using drugs since the age of 59 and IV drugs since the age of 25.  She currently is using a combination of heroin, methamphetamine, fentanyl.  She states her last dose was 2 days ago.  She has mostly been seen at outside institutions, I have been able to review the majority of these records and it appears that she continues to recur to these institutions because of skin and soft tissue infections related to IV drug use and picking it.  She continues to pick at most of the spots on her body including the head the neck the arms and the legs.  Today she is complaining of having abdominal discomfort which is in the periumbilical region.  This is not something that has been imaged as I have looked over her medical records that are available and see no imaging of the abdomen for the last couple of years.  She has known to have hepatitis C and a urinalysis from a couple of days ago did show some bilirubin in the urine.  She did not have a metabolic panel showing liver function at that time and going back through May I cannot find those results.  She denies diarrhea but has had some nausea vomiting with this abdominal discomfort.  She reports this happens every time she tries to go off the heroin and that she cannot keep down her antibiotics.  Over the last couple of weeks she has been on multiple courses of antibiotics including Bactrim, Cleocin, doxycycline and most recently back onto the Cleocin.  This seems to be related to skin and soft tissue infections primarily.  She has  not had anything for the abdominal pain.  She has been in and out of rehabilitation centers 7 or 8 times.  Past Medical History:  Diagnosis Date   Hepatitis C    OCD (obsessive compulsive disorder)     There are no active problems to display for this patient.   History reviewed. No pertinent surgical history.   OB History   No obstetric history on file.      Home Medications    Prior to Admission medications   Medication Sig Start Date End Date Taking? Authorizing Provider  clindamycin (CLEOCIN) 150 MG capsule Take 3 capsules (450 mg total) by mouth 3 (three) times daily. 09/12/16   Pisciotta, Elmyra Ricks, PA-C  FLUoxetine (PROZAC) 10 MG capsule Take 10 mg by mouth daily.    [provider]  ibuprofen (ADVIL,MOTRIN) 800 MG tablet Take 1 tablet (800 mg total) by mouth 3 (three) times daily. 09/12/16   Pisciotta, Elmyra Ricks, PA-C  loratadine (CLARITIN) 10 MG tablet Take 1 tablet (10 mg total) by mouth daily. 08/27/15   Palumbo, April, MD  polyethylene glycol [facility]) packet Take 17 g by mouth 2 (two) times daily. 08/02/15   Davonna Belling, MD  predniSONE (DELTASONE) 20 MG tablet 3 tabs po day one, then 2 po daily x 4 days 08/27/15   Palumbo, April, MD  Sennosides 15 MG CHEW Chew 2 tablets (30 mg total) by  mouth daily. Until bowel movement 08/02/15   Benjiman Core, MD    Family History No family history on file.  Social History Social History   Tobacco Use   Smoking status: Current Every Day Smoker   Smokeless tobacco: Never Used  Substance Use Topics   Alcohol use: No   Drug use: Yes    Comment: opiate addiction, heroine     Allergies   Patient has no known allergies.   Review of Systems Review of Systems  All other systems reviewed and are negative.    Physical Exam Updated Vital Signs BP (!) 129/94 (BP Location: Right Arm)    Pulse 92    Temp 97.9 F (36.6 C) (Oral)    Resp 17    Ht 1.727 m (5\' 8" )    Wt 78.5 kg    LMP 08/29/2018 (Exact Date)    SpO2  99%    BMI 26.30 kg/m   Physical Exam Vitals signs and nursing note reviewed.  Constitutional:      General: She is not in acute distress.    Appearance: She is well-developed.     Comments: Tearful  HENT:     Head: Normocephalic and atraumatic.     Mouth/Throat:     Pharynx: No oropharyngeal exudate.  Eyes:     General: No scleral icterus.       Right eye: No discharge.        Left eye: No discharge.     Pupils: Pupils are equal, round, and reactive to light.     Comments: Jaundice  Neck:     Musculoskeletal: Normal range of motion and neck supple.     Thyroid: No thyromegaly.     Vascular: No JVD.  Cardiovascular:     Rate and Rhythm: Normal rate and regular rhythm.     Heart sounds: Normal heart sounds. No murmur. No friction rub. No gallop.      Comments: No heart murmur Pulmonary:     Effort: Pulmonary effort is normal. No respiratory distress.     Breath sounds: Normal breath sounds. No wheezing or rales.  Abdominal:     General: Bowel sounds are normal. There is no distension.     Palpations: Abdomen is soft. There is no mass.     Tenderness: There is abdominal tenderness ( Mild periumbilical tenderness, no right upper quadrant epigastric or suprapubic tenderness, no guarding or peritoneal signs, very soft abdomen).  Musculoskeletal: Normal range of motion.        General: No tenderness.  Lymphadenopathy:     Cervical: No cervical adenopathy.  Skin:    General: Skin is warm and dry.     Findings: No erythema or rash.     Comments: Multiple superficial scabbed over open wounds, these are clean appearing without any drainage, there is no surrounding redness, there is no foul smell, these involve multiple parts of her body  Neurological:     Mental Status: She is alert.     Coordination: Coordination normal.     Comments: The patient is awake alert and following commands, no tremor, normal strength, no facial droop  Psychiatric:     Comments: Anxious appearing,  tearful, distraught, depressed, she is not suicidal      ED Treatments / Results  Labs (all labs ordered are listed, but only abnormal results are displayed) Labs Reviewed  CBC WITH DIFFERENTIAL/PLATELET - Abnormal; Notable for the following components:      Result Value   RDW  17.2 (*)    Lymphs Abs 4.3 (*)    All other components within normal limits  COMPREHENSIVE METABOLIC PANEL - Abnormal; Notable for the following components:   CO2 21 (*)    Creatinine, Ser 1.20 (*)    Total Protein 8.6 (*)    Albumin 3.4 (*)    AST 142 (*)    ALT 170 (*)    Alkaline Phosphatase 181 (*)    Total Bilirubin 4.4 (*)    GFR calc non Af Amer 59 (*)    All other components within normal limits  URINALYSIS, ROUTINE W REFLEX MICROSCOPIC - Abnormal; Notable for the following components:   Color, Urine AMBER (*)    APPearance CLOUDY (*)    Hgb urine dipstick LARGE (*)    Protein, ur 100 (*)    RBC / HPF >50 (*)    All other components within normal limits  RAPID URINE DRUG SCREEN, HOSP PERFORMED - Abnormal; Notable for the following components:   Amphetamines POSITIVE (*)    Tetrahydrocannabinol POSITIVE (*)    All other components within normal limits  ACETAMINOPHEN LEVEL - Abnormal; Notable for the following components:   Acetaminophen (Tylenol), Serum <10 (*)    All other components within normal limits  SALICYLATE LEVEL  HEPATITIS PANEL, ACUTE  POC URINE PREG, ED    EKG None  Radiology Ct Abdomen Pelvis W Contrast  Result Date: 11/07/2018 CLINICAL DATA:  Nonlocalized abdominal pain EXAM: CT ABDOMEN AND PELVIS WITH CONTRAST TECHNIQUE: Multidetector CT imaging of the abdomen and pelvis was performed using the standard protocol following bolus administration of intravenous contrast. CONTRAST:  OMNIPAQUE IOHEXOL 300 MG/ML  SOLN COMPARISON:  Radiograph 08/02/2015 FINDINGS: Lower chest: Lung bases demonstrate no acute consolidation or effusion. The heart size is normal Hepatobiliary:  No focal liver abnormality is seen. No gallstones, gallbladder wall thickening, or biliary dilatation. Pancreas: Unremarkable. No pancreatic ductal dilatation or surrounding inflammatory changes. Spleen: Normal in size without focal abnormality. Adrenals/Urinary Tract: Adrenal glands are unremarkable. Kidneys are normal, without renal calculi, focal lesion, or hydronephrosis. Bladder is unremarkable. Stomach/Bowel: Stomach is within normal limits. Appendix appears normal. No evidence of bowel wall thickening, distention, or inflammatory changes. Vascular/Lymphatic: No significant vascular findings are present. No enlarged abdominal or pelvic lymph nodes. Reproductive: Uterus and bilateral adnexa are unremarkable. Other: No abdominal wall hernia or abnormality. No abdominopelvic ascites. Musculoskeletal: No acute or significant osseous findings. IMPRESSION: Negative. No CT evidence for acute intra-abdominal or pelvic abnormality. Electronically Signed   By: Jasmine Pang M.D.   On: 11/07/2018 21:24    Procedures Procedures (including critical care time)  Medications Ordered in ED Medications  0.9 %  sodium chloride infusion (has no administration in time range)  iohexol (OMNIPAQUE) 300 MG/ML solution 100 mL (100 mLs Intravenous Contrast Given 11/07/18 2058)     Initial Impression / Assessment and Plan / ED Course  I have reviewed the triage vital signs and the nursing notes.  Pertinent labs & imaging results that were available during my care of the patient were reviewed by me and considered in my medical decision making (see chart for details).       The patient's labs do in fact reveal that she has an elevated bilirubin of close to 4-1/2, this is consistent with her history of recent hematuria which in fact is more likely to be bilirubin.  She has a benign abdomen but due to this increase in liver function will investigate as to the cause.  She denies taking overdoses of acetaminophen and in  fact does not use this at all.  She takes lots of ibuprofen.  CT scan pending, I will need to place an ultrasound-guided IV as the nurses missed multiple times that she is a difficult stick and is used IV drugs so much she has very little peripheral access.  She does not appear to be tachycardic hypotensive diaphoretic and is not actively vomiting.  The patient is reassured by her results, she obviously has some kind of mild active hepatitis but has a negative CT scan showing no signs of anatomical obstruction.  Urinalysis reveals no infection.  There is no leukocytosis.  There is no fever or tachycardia.  She is perseverating on the idea that she has infections in these open wounds especially the one behind her right ear.  On exam this does have a shallow open erythematous base but no foul smell, no surrounding redness.  She will get IV clindamycin while she is here.  She is also getting IV fluids and Zofran.  I discussed her care with the [facility] behavioral health consult team who will see her in consultation to see if she meets criteria for inpatient treatment of her substance abuse and depression.  9:43 PM  TT[facility] has seen patient - they want patient reevaluated in the morning for a repeat exam to determine disposition.  Change of shift - care signed out to Dr. Manus Gunningancour.   Final Clinical Impressions(s) / ED Diagnoses   Final diagnoses:  Polysubstance abuse (HCC)  Periumbilical abdominal pain      Eber HongMiller, Damauri Minion, MD 11/07/18 2310

## 2018-11-07 NOTE — Care Management (Signed)
Patient will remain in the ED overnight and will be re-assessed in the morning for a final disposition.  Per Corene Cornea, NP

## 2018-11-08 ENCOUNTER — Other Ambulatory Visit: Payer: Self-pay

## 2018-11-08 DIAGNOSIS — F1994 Other psychoactive substance use, unspecified with psychoactive substance-induced mood disorder: Secondary | ICD-10-CM | POA: Insufficient documentation

## 2018-11-08 DIAGNOSIS — F339 Major depressive disorder, recurrent, unspecified: Secondary | ICD-10-CM | POA: Insufficient documentation

## 2018-11-08 MED ORDER — ONDANSETRON HCL 4 MG PO TABS: 4.0000 mg | ORAL_TABLET | Freq: Three times a day (TID) | ORAL | 0 refills | Status: DC | PRN

## 2018-11-08 MED ORDER — DICYCLOMINE HCL 20 MG PO TABS: 20.0000 mg | ORAL_TABLET | Freq: Two times a day (BID) | ORAL | 0 refills | Status: DC

## 2018-11-08 NOTE — ED Provider Notes (Signed)
Briefly, patient is a 33 year old female who presented to the emergency department requesting detox complaining of abdominal pain yesterday.  She was medically cleared and evaluated by behavioral health.  I saw her on morning rounds this morning and was informed by nursing staff that she had been complaining of abdominal pain.  On my evaluation, patient states that she has periumbilical abdominal pain   Physical Exam  BP 115/86 (BP Location: Left Arm)   Pulse (!) 54   Temp 98.4 F (36.9 C) (Oral)   Resp 16   Ht 5\' 8"  (1.727 m)   Wt 78.5 kg   LMP 08/29/2018 (Exact Date)   SpO2 98%   BMI 26.30 kg/m   Physical Exam Vitals signs and nursing note reviewed.  Constitutional:      General: She is not in acute distress.    Appearance: She is well-developed.  HENT:     Head: Normocephalic and atraumatic.  Eyes:     Conjunctiva/sclera: Conjunctivae normal.  Neck:     Musculoskeletal: Neck supple.  Cardiovascular:     Rate and Rhythm: Normal rate.  Pulmonary:     Effort: Pulmonary effort is normal.  Abdominal:     General: Bowel sounds are normal.     Palpations: Abdomen is soft.     Tenderness: There is no abdominal tenderness. There is no guarding or rebound.  Musculoskeletal: Normal range of motion.  Skin:    General: Skin is warm and dry.  Neurological:     Mental Status: She is alert.     ED Course/Procedures     Procedures  Results for orders placed or performed during the hospital encounter of 11/07/18  CBC with Differential  Result Value Ref Range   WBC 8.1 4.0 - 10.5 K/uL   RBC 4.57 3.87 - 5.11 MIL/uL   Hemoglobin 13.8 12.0 - 15.0 g/dL   HCT 16.140.7 09.636.0 - 04.546.0 %   MCV 89.1 80.0 - 100.0 fL   MCH 30.2 26.0 - 34.0 pg   MCHC 33.9 30.0 - 36.0 g/dL   RDW 40.917.2 (H) 81.111.5 - 91.415.5 %   Platelets 222 150 - 400 K/uL   nRBC 0.0 0.0 - 0.2 %   Neutrophils Relative % 36 %   Neutro Abs 2.9 1.7 - 7.7 K/uL   Lymphocytes Relative 54 %   Lymphs Abs 4.3 (H) 0.7 - 4.0 K/uL   Monocytes Relative 8 %   Monocytes Absolute 0.6 0.1 - 1.0 K/uL   Eosinophils Relative 2 %   Eosinophils Absolute 0.2 0.0 - 0.5 K/uL   Basophils Relative 0 %   Basophils Absolute 0.0 0.0 - 0.1 K/uL   Immature Granulocytes 0 %   Abs Immature Granulocytes 0.02 0.00 - 0.07 K/uL  Comprehensive metabolic panel  Result Value Ref Range   Sodium 137 135 - 145 mmol/L   Potassium 3.7 3.5 - 5.1 mmol/L   Chloride 107 98 - 111 mmol/L   CO2 21 (L) 22 - 32 mmol/L   Glucose, Bld 88 70 - 99 mg/dL   BUN 12 6 - 20 mg/dL   Creatinine, Ser 7.821.20 (H) 0.44 - 1.00 mg/dL   Calcium 8.9 8.9 - 95.610.3 mg/dL   Total Protein 8.6 (H) 6.5 - 8.1 g/dL   Albumin 3.4 (L) 3.5 - 5.0 g/dL   AST 213142 (H) 15 - 41 U/L   ALT 170 (H) 0 - 44 U/L   Alkaline Phosphatase 181 (H) 38 - 126 U/L   Total Bilirubin 4.4 (H)  0.3 - 1.2 mg/dL   GFR calc non Af Amer 59 (L) >60 mL/min   GFR calc Af Amer >60 >60 mL/min   Anion gap 9 5 - 15  Urinalysis, Routine w reflex microscopic  Result Value Ref Range   Color, Urine AMBER (A) YELLOW   APPearance CLOUDY (A) CLEAR   Specific Gravity, Urine 1.016 1.005 - 1.030   pH 5.0 5.0 - 8.0   Glucose, UA NEGATIVE NEGATIVE mg/dL   Hgb urine dipstick LARGE (A) NEGATIVE   Bilirubin Urine NEGATIVE NEGATIVE   Ketones, ur NEGATIVE NEGATIVE mg/dL   Protein, ur 100 (A) NEGATIVE mg/dL   Nitrite NEGATIVE NEGATIVE   Leukocytes,Ua NEGATIVE NEGATIVE   RBC / HPF >50 (H) 0 - 5 RBC/hpf   Bacteria, UA NONE SEEN NONE SEEN   Squamous Epithelial / LPF 0-5 0 - 5   Mucus PRESENT   Rapid urine drug screen (hospital performed)  Result Value Ref Range   Opiates NONE DETECTED NONE DETECTED   Cocaine NONE DETECTED NONE DETECTED   Benzodiazepines NONE DETECTED NONE DETECTED   Amphetamines POSITIVE (A) NONE DETECTED   Tetrahydrocannabinol POSITIVE (A) NONE DETECTED   Barbiturates NONE DETECTED NONE DETECTED  Acetaminophen level  Result Value Ref Range   Acetaminophen (Tylenol), Serum <10 (L) 10 - 30 ug/mL  Salicylate  level  Result Value Ref Range   Salicylate Lvl <0.8 2.8 - 30.0 mg/dL  POC Urine Pregnancy, ED (not at [facility])  Result Value Ref Range   Preg Test, Ur NEGATIVE NEGATIVE   Ct Abdomen Pelvis W Contrast  Result Date: 11/07/2018 CLINICAL DATA:  Nonlocalized abdominal pain EXAM: CT ABDOMEN AND PELVIS WITH CONTRAST TECHNIQUE: Multidetector CT imaging of the abdomen and pelvis was performed using the standard protocol following bolus administration of intravenous contrast. CONTRAST:  16mL OMNIPAQUE IOHEXOL 300 MG/ML  SOLN COMPARISON:  Radiograph 08/02/2015 FINDINGS: Lower chest: Lung bases demonstrate no acute consolidation or effusion. The heart size is normal Hepatobiliary: No focal liver abnormality is seen. No gallstones, gallbladder wall thickening, or biliary dilatation. Pancreas: Unremarkable. No pancreatic ductal dilatation or surrounding inflammatory changes. Spleen: Normal in size without focal abnormality. Adrenals/Urinary Tract: Adrenal glands are unremarkable. Kidneys are normal, without renal calculi, focal lesion, or hydronephrosis. Bladder is unremarkable. Stomach/Bowel: Stomach is within normal limits. Appendix appears normal. No evidence of bowel wall thickening, distention, or inflammatory changes. Vascular/Lymphatic: No significant vascular findings are present. No enlarged abdominal or pelvic lymph nodes. Reproductive: Uterus and bilateral adnexa are unremarkable. Other: No abdominal wall hernia or abnormality. No abdominopelvic ascites. Musculoskeletal: No acute or significant osseous findings. IMPRESSION: Negative. No CT evidence for acute intra-abdominal or pelvic abnormality. Electronically Signed   By: Donavan Foil M.D.   On: 11/07/2018 21:24     MDM   Briefly, patient is a 33 year old female who presented to the emergency department requesting detox complaining of abdominal pain yesterday.  She was medically cleared and evaluated by behavioral health.  I saw her on morning rounds  this morning and was informed by nursing staff that she had been complaining of abdominal pain.  On my evaluation, patient states that she has periumbilical abdominal pain.  She states this pain started yesterday and the pain is the same as it was yesterday.  She states it is associated with her not using heroin for the last 2 days.  She has had no further episodes of vomiting.  No diarrhea in the ED.  She has had no fevers.  I reviewed her  work-up from yesterday which revealed elevated LFTs and elevated bilirubin.  There is suspicion for possible hepatitis and a hepatitis panel was drawn but has not yet resulted.  She also had a CT scan that did not show any acute findings in the abdomen/pelvis.  On exam today abdomen is soft and nontender.  No guarding, really no rebound tenderness.  I doubt acute process.  I feel that this is likely either related to hepatitis or withdrawal from opioids.  I will give Rx for symptomatic treatment.  I have given information to follow-up with the community health and wellness clinic as well as GI.  Advised on return precautions.  She was also given community resources for substance use.  Peer support was consulted.  Patient eloped prior to receiving discharge paperwork.       Rayne Du 11/08/18 1146    Raeford Razor, MD 11/08/18 1149

## 2018-11-08 NOTE — ED Notes (Signed)
Breakfast ordered 

## 2018-11-08 NOTE — Consult Note (Signed)
Telepsych Consultation   Reason for Consult:  Evaluate substance abuse and depression Referring Physician:  Dr. Noemi Chapel Location of Patient: [facility] Location of Provider: [place] [facility]  Patient Identification: Leslie Dyer MRN:  [id number] Principal Diagnosis: Hepatitis C;  Diagnosis:  Substance induced mood disorder  Total Time spent with patient: 30 minutes  Subjective:  "I just want to stop the heroine"  Leslie Dyer is a 33 y.o. female patient admitted with periumbilical pain.  HPI:  Per [facility] Ad[facility] Assessment dated 11/07/2018:  Patient is a 33 year old female.  Patient was accompanied by her father.  Patient denies SI/HI/Psychosis.  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  Patient reports that she wants assistance with substance abuse on an outpatient basis because she has open wounds where she has picked at her skin.  Patient reports using heron on a daily basis  Patient also reports that she has a liver infection that has caused her eyes to turn yellow and her urine brown.  Patient reports several inpatient hospitalizations for substance abuse and OCD. Patient denies prior suicide attempts.  Patient reports prior outpatient therapy for substance abuse treatment at [facility].    Tele Assessment  Patient seen via telepsych by this provider; chart reviewed and consulted with Dr. Dwyane Dee on 11/08/18.  On evaluation Leslie Dyer reports a long standing history for using heroine and states she wants to stop using the drug.  She collaborates the information from previous emergency department physician and Leslie Dyer notes.  States she was previously told that inpatient tx is deferred pending resolution of active open sores to her body.  Leslie Dyer, reached out to Leslie Dyer, who states they will not accept patients for inpatient detox with active sores.  Albeit, she reports she came to the ED for abdominal pain, endorses history for liver concerns and  she .  During evaluation Leslie Dyer is laying in bed but adjusts to upright position to communicate with this Probation officer.  She is alert/oriented x 4; anxious but cooperative, willing to engage in interview.  Her mood congruent with affect, at time labile.  Patient is speaking in a clear tone at loud volume, and normal pace; with fair eye contact.  Her thought process is coherent and goal oriented.  Patient denies suicidal/self-harm/homicidal ideation, psychosis, and paranoia.There is no indication that she is currently responding to internal/external stimuli or experiencing delusional thought content.  When offered the number for peer support for inpatient/outpatient follow-up, the patient became visibly agitated, demonstrated increased psychomotor agitation, and began throwing her covers off.  She relates her being offered the peer support contact information to correlate, "no one care because I do not have insurance." Several attempts to redirect, and display empathetic listening, unsuccessful. She abruptly ends the interview with this Probation officer and calls her father on her cell phone.  Above information was communicated with emergency room provider, PA Tanzania Henderly.       Past Psychiatric History:   Risk to Self: Suicidal Ideation: No Suicidal Intent: No Is patient at risk for suicide?: No Suicidal Plan?: No(na) Access to Means: No What has been your use of drugs/alcohol within the last 12 months?: na How many times?: 0 Other Self Harm Risks: na Triggers for Past Attempts: None known Intentional Self Injurious Behavior: (picking at her skin ) Risk to Others: Homicidal Ideation: No Thoughts of Harm to Others: No Current Homicidal Intent: No Current Homicidal Plan: No Access to Homicidal Means: No Identified Victim: na History  of harm to others?: No Assessment of Violence: None Noted Violent Behavior Description: na Does patient have access to weapons?: Yes (Comment) Criminal Charges  Pending?: Yes Describe Pending Criminal Charges: probation violation  Does patient have a court date: Yes Court Date: (unable to remember the date) Prior Inpatient Therapy: Prior Inpatient Therapy: Yes Prior Therapy Dates: Mutiple  Prior Therapy Facilty/Provider(s): [facility], [facility], Reason for Treatment: substance abuse  Prior Outpatient Therapy: Prior Outpatient Therapy: Yes Prior Therapy Dates: 2019 Prior Therapy Facilty/Provider(s): [facility] Reason for Treatment: substance abuse  Does patient have an ACCT team?: No Does patient have Intensive In-House Services?  : No Does patient have [facility] services? : No Does patient have P4CC services?: No  Past Medical History:  Past Medical History:  Diagnosis Date  . Hepatitis C   . OCD (obsessive compulsive disorder)    History reviewed. No pertinent surgical history. Family History: No family history on file. Family Psychiatric  History: unknow Social History:  Social History   Substance and Sexual Activity  Alcohol Use No     Social History   Substance and Sexual Activity  Drug Use Yes   Comment: opiate addiction, heroine    Social History   Socioeconomic History  . Marital status: Single    Spouse name: Not on file  . Number of children: Not on file  . Years of education: Not on file  . Highest education level: Not on file  Occupational History  . Not on file  Social Needs  . Financial resource strain: Not on file  . Food insecurity    Worry: Not on file    Inability: Not on file  . Transportation needs    Medical: Not on file    Non-medical: Not on file  Tobacco Use  . Smoking status: Current Every Day Smoker  . Smokeless tobacco: Never Used  Substance and Sexual Activity  . Alcohol use: No  . Drug use: Yes    Comment: opiate addiction, heroine  . Sexual activity: Not on file  Lifestyle  . Physical activity    Days per week: Not on file    Minutes per session: Not on file  . Stress: Not on file   Relationships  . Social Musician on phone: Not on file    Gets together: Not on file    Attends religious service: Not on file    Active member of club or organization: Not on file    Attends meetings of clubs or organizations: Not on file    Relationship status: Not on file  Other Topics Concern  . Not on file  Social History Narrative  . Not on file   Additional Social History:    Allergies:  No Known Allergies  Labs:  Results for orders placed or performed during the hospital encounter of 11/07/18 (from the past 48 hour(s))  CBC with Differential     Status: Abnormal   Collection Time: 11/07/18  5:24 PM  Result Value Ref Range   WBC 8.1 4.0 - 10.5 K/uL   RBC 4.57 3.87 - 5.11 MIL/uL   Hemoglobin 13.8 12.0 - 15.0 g/dL   HCT 26.9 48.5 - 46.2 %   MCV 89.1 80.0 - 100.0 fL   MCH 30.2 26.0 - 34.0 pg   MCHC 33.9 30.0 - 36.0 g/dL   RDW 70.3 (H) 50.0 - 93.8 %   Platelets 222 150 - 400 K/uL   nRBC 0.0 0.0 - 0.2 %  Neutrophils Relative % 36 %   Neutro Abs 2.9 1.7 - 7.7 K/uL   Lymphocytes Relative 54 %   Lymphs Abs 4.3 (H) 0.7 - 4.0 K/uL   Monocytes Relative 8 %   Monocytes Absolute 0.6 0.1 - 1.0 K/uL   Eosinophils Relative 2 %   Eosinophils Absolute 0.2 0.0 - 0.5 K/uL   Basophils Relative 0 %   Basophils Absolute 0.0 0.0 - 0.1 K/uL   Immature Granulocytes 0 %   Abs Immature Granulocytes 0.02 0.00 - 0.07 K/uL    Comment: Performed at [facility], [address] [place], [place] [address]  Comprehensive metabolic panel     Status: Abnormal   Collection Time: 11/07/18  5:24 PM  Result Value Ref Range   Sodium 137 135 - 145 mmol/L   Potassium 3.7 3.5 - 5.1 mmol/L   Chloride 107 98 - 111 mmol/L   CO2 21 (L) 22 - 32 mmol/L   Glucose, Bld 88 70 - 99 mg/dL   BUN 12 6 - 20 mg/dL   Creatinine, Ser 6.041.20 (H) 0.44 - 1.00 mg/dL   Calcium 8.9 8.9 - 54.010.3 mg/dL   Total Protein 8.6 (H) 6.5 - 8.1 g/dL   Albumin 3.4 (L) 3.5 - 5.0 g/dL   AST 981142 (H) 15 - 41 U/L    ALT 170 (H) 0 - 44 U/L   Alkaline Phosphatase 181 (H) 38 - 126 U/L   Total Bilirubin 4.4 (H) 0.3 - 1.2 mg/dL   GFR calc non Af Amer 59 (L) >60 mL/min   GFR calc Af Amer >60 >60 mL/min   Anion gap 9 5 - 15    Comment: Performed at [facility], [address] [place], [place] [address]  Urinalysis, Routine w reflex microscopic     Status: Abnormal   Collection Time: 11/07/18  6:47 PM  Result Value Ref Range   Color, Urine AMBER (A) YELLOW   APPearance CLOUDY (A) CLEAR   Specific Gravity, Urine 1.016 1.005 - 1.030   pH 5.0 5.0 - 8.0   Glucose, UA NEGATIVE NEGATIVE mg/dL   Hgb urine dipstick LARGE (A) NEGATIVE   Bilirubin Urine NEGATIVE NEGATIVE   Ketones, ur NEGATIVE NEGATIVE mg/dL   Protein, ur 829100 (A) NEGATIVE mg/dL   Nitrite NEGATIVE NEGATIVE   Leukocytes,Ua NEGATIVE NEGATIVE   RBC / HPF >50 (H) 0 - 5 RBC/hpf   Bacteria, UA NONE SEEN NONE SEEN   Squamous Epithelial / LPF 0-5 0 - 5   Mucus PRESENT     Comment: Performed at [facility], [address] [place], [place] [address]  Rapid urine drug screen (hospital performed)     Status: Abnormal   Collection Time: 11/07/18  6:47 PM  Result Value Ref Range   Opiates NONE DETECTED NONE DETECTED   Cocaine NONE DETECTED NONE DETECTED   Benzodiazepines NONE DETECTED NONE DETECTED   Amphetamines POSITIVE (A) NONE DETECTED   Tetrahydrocannabinol POSITIVE (A) NONE DETECTED   Barbiturates NONE DETECTED NONE DETECTED    Comment: (NOTE) DRUG SCREEN FOR MEDICAL PURPOSES ONLY.  IF CONFIRMATION IS NEEDED FOR ANY PURPOSE, NOTIFY LAB WITHIN 5 DAYS. LOWEST DETECTABLE LIMITS FOR URINE DRUG SCREEN Drug Class                     Cutoff (ng/mL) Amphetamine and metabolites    1000 Barbiturate and metabolites    200 Benzodiazepine  200 Tricyclics and metabolites     300 Opiates and metabolites        300 Cocaine and metabolites        300 THC                            50 Performed at [facility],  [address] [place], [place] [address]   POC Urine Pregnancy, ED (not at [facility])     Status: None   Collection Time: 11/07/18  6:59 PM  Result Value Ref Range   Preg Test, Ur NEGATIVE NEGATIVE    Comment:        THE SENSITIVITY OF THIS METHODOLOGY IS >24 mIU/mL   Acetaminophen level     Status: Abnormal   Collection Time: 11/07/18  8:22 PM  Result Value Ref Range   Acetaminophen (Tylenol), Serum <10 (L) 10 - 30 ug/mL    Comment: Performed at [facility], [address], [place], [place] [address]  Salicylate level     Status: None   Collection Time: 11/07/18  8:22 PM  Result Value Ref Range   Salicylate Lvl <7.0 2.8 - 30.0 mg/dL    Comment: Performed at [facility], [address], [place], [place] [address]    Medications:  No current facility-administered medications for this encounter.    Current Outpatient Medications  Medication Sig Dispense Refill  . clindamycin (CLEOCIN) 150 MG capsule Take 3 capsules (450 mg total) by mouth 3 (three) times daily. 90 capsule 0  . dicyclomine (BENTYL) 20 MG tablet Take 1 tablet (20 mg total) by mouth 2 (two) times daily for 3 days. 6 tablet 0  . FLUoxetine (PROZAC) 10 MG capsule Take 10 mg by mouth daily.    Marland Kitchen ibuprofen (ADVIL,MOTRIN) 800 MG tablet Take 1 tablet (800 mg total) by mouth 3 (three) times daily. 21 tablet 0  . loratadine (CLARITIN) 10 MG tablet Take 1 tablet (10 mg total) by mouth daily. 10 tablet 0  . ondansetron (ZOFRAN) 4 MG tablet Take 1 tablet (4 mg total) by mouth every 8 (eight) hours as needed for nausea or vomiting. 6 tablet 0  . polyethylene glycol (MIRALAX) packet Take 17 g by mouth 2 (two) times daily. 14 each 0  . predniSONE (DELTASONE) 20 MG tablet 3 tabs po day one, then 2 po daily x 4 days 11 tablet 0  . Sennosides 15 MG CHEW Chew 2 tablets (30 mg total) by mouth daily. Until bowel movement 10 each 0    Musculoskeletal: Unable to evaluate via telepsych  Psychiatric Specialty Exam: Physical Exam   Constitutional: She is oriented to person, place, and time. She appears well-developed.  Musculoskeletal: Normal range of motion.  Neurological: She is alert and oriented to person, place, and time.  Psychiatric: She has a normal mood and affect. Judgment and thought content normal.    Review of Systems  Gastrointestinal: Positive for abdominal pain (preumbilical pain, reason for current admission). Negative for diarrhea.  Psychiatric/Behavioral: Positive for depression (but denies suicidial ideations or plan) and substance abuse (active, polysubstance use heroine, meth ampethamine). Negative for hallucinations, memory loss and suicidal ideas. The patient is nervous/anxious.     Blood pressure 115/86, pulse (!) 54, temperature 98.4 F (36.9 C), temperature source Oral, resp. rate 16, height  (1.727 m), weight 78.5 kg, last menstrual period 08/29/2018, SpO2 98 %.Body mass index is 26.3 kg/m.  General Appearance:  Bizarre  Eye Contact:  Fair  Speech:  Pressured and but clear  Volume:  Increased  Mood:  Anxious, Depressed and Irritable  Affect:  Congruent and Labile  Thought Process:  Goal Directed  Orientation:  Full (Time, Place, and Person)  Thought Content:  Negative  Suicidal Thoughts:  No  Homicidal Thoughts:  No  Memory:  unable to assess, patient agitated  Judgement:  Other:  unable to assess, patient agitated and abruptly ended interview  Insight:  Lacking  Psychomotor Activity:  Increased  Concentration:  Concentration: Poor and Attention Span: Poor  Recall:  Negative  Fund of Knowledge:  Good  Language:  Fair  Akathisia:  Negative  Handed:  Right  AIMS (if indicated):     Assets:  Desire for Improvement Resilience  ADL's:  Impaired  Cognition:  WNL  Sleep:   >6 hours    Treatment Plan Summary: Plan See below  Recommend SW provide outpatient referral to community clinic for medical management of her wounds.   Recommend gabapentin  TID for withdrawal  symptoms  Disposition: No evidence of imminent risk to self or others at present.   Patient does not meet criteria for psychiatric inpatient admission. Supportive therapy provided about ongoing stressors. Discussed crisis plan, support from social network, calling 911, coming to the Emergency Department, and calling Suicide Hotline.  Patient given # to peer support for outpatient follow-up [phone]   This service was provided via telemedicine using a 2-way, interactive audio and video technology.  Spoke with [place], [place]; informed of above recommendation and disposition  Names of all persons participating in this telemedicine service and their role in this encounter. Name: Ophelia Shoulder Role: PMHNP  Name: Nelly Rout Role: Psychiatrist  Name: Neomia Dear Role: Patient    Chales Abrahams, NP 11/08/2018 11:55 AM

## 2018-11-08 NOTE — ED Notes (Addendum)
Pt noted to be upset after Telepsych. States she came to ED to get open sores treated so can attempt to get into a drug rehab. States they will not accept her w/open wounds. Pt noted to be irritable. Aware of tx plan - to be d/c'd to home w/resources. Pt upset and states "I'm being forced to go out and use". Pt asking why she was allowed to stay overnight "if you weren't going to do anything and just discharge me?" Advised pt she was able to get some rest in a safe place. However, pt had advised TTS she is homeless. States "I already have a safe place". Voiced agreement that she was away from any area to be able to use drugs. Allowed pt to vent feelings/concerns. Continues to deny SI/HI.

## 2018-11-08 NOTE — ED Notes (Signed)
Pt's breakfast tray arrived 

## 2018-11-08 NOTE — ED Notes (Addendum)
Pt refused to wait for d/c paperwork. Pt states she spoke w/her father on phone and she has found a Suboxone clinic. Outpt resources discussed and given to pt. Peer Support phone number given to pt. Pt voiced she has all of her belongings. States she has antibiotic that had previously gotten filled. Pt declined to sign signature pad.

## 2018-11-08 NOTE — ED Notes (Signed)
Pt resting on stretcher with eyes closed, RR even and unlabored, stretcher locked in lowest position, call bell within reach. Will continue to monitor.

## 2018-11-08 NOTE — ED Notes (Signed)
Pt ambulated to BR with steady gait, requesting ginger ale.

## 2018-11-08 NOTE — ED Notes (Signed)
Pt noted w/open sores to face and posterior to right ear - states she uses Heroin and Meth. States she wants help w/detox. Denies SI/HI.

## 2018-11-08 NOTE — Discharge Instructions (Signed)
Your liver enzymes were elevated and we sent a hepatitis panel. You will need to follow up with either your primary care doctor or with the gastroenterologist about the results of this liver panel.   Take medications as prescribed.   Please return to the emergency department for any new or worsening symptoms.

## 2018-11-08 NOTE — ED Notes (Signed)
Belongings noted to be in room on counter. If after re-TTS pt meets Inpt criteria, belongings will be removed from room for safety.

## 2018-11-10 LAB — HEPATITIS PANEL, ACUTE
HCV Ab: >11 s/co ratio — ABNORMAL HIGH (ref 0.0–0.9)
Hep A IgM: POSITIVE — AB
Hep B C IgM: NEGATIVE
Hepatitis B Surface Ag: NEGATIVE

## 2021-05-20 ENCOUNTER — Emergency Department (HOSPITAL_BASED_OUTPATIENT_CLINIC_OR_DEPARTMENT_OTHER)
Admission: EM | Admit: 2021-05-20 | Discharge: 2021-05-20 | Disposition: A | Discharge status: Home / Self Care | Payer: Self-pay | Attending: Emergency Medicine | Admitting: Emergency Medicine

## 2021-05-20 ENCOUNTER — Other Ambulatory Visit: Payer: Self-pay

## 2021-05-20 DIAGNOSIS — S0190XA Unspecified open wound of unspecified part of head, initial encounter: Secondary | ICD-10-CM

## 2021-05-20 DIAGNOSIS — Z4800 Encounter for change or removal of nonsurgical wound dressing: Secondary | ICD-10-CM | POA: Insufficient documentation

## 2021-05-20 DIAGNOSIS — S1190XD Unspecified open wound of unspecified part of neck, subsequent encounter: Secondary | ICD-10-CM | POA: Insufficient documentation

## 2021-05-20 DIAGNOSIS — X58XXXD Exposure to other specified factors, subsequent encounter: Secondary | ICD-10-CM | POA: Insufficient documentation

## 2021-05-20 NOTE — ED Notes (Signed)
ED Provider at bedside. 

## 2021-05-20 NOTE — ED Provider Notes (Signed)
? ?  [facility] EMERGENCY DEPT  ?Provider Note ? ?CSN: [id number] ?Arrival date & time: 05/20/21 0513 ? ?History ?Chief Complaint  ?Patient presents with  ? Wound Check  ? ? ?Leslie Dyer is a 36 y.o. 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 She was at the [facility] ED about 10 days ago for same, given Rx for doxycycline and referral to dermatology. She has been to numerous area EDs and even had admissions for this same wound. Does not respond to Abx. She has been unable to follow up with Derm due to lack of insurance. No fever today.  ? ? ?Home Medications ?Prior to Admission medications   ?Medication Sig Start Date End Date Taking? Authorizing Provider  ?FLUoxetine (PROZAC) 10 MG capsule Take 10 mg by mouth daily.    [provider]  ? ? ? ?Allergies    ?Patient has no known allergies. ? ? ?Review of Systems   ?Review of Systems ?Please see HPI for pertinent positives and negatives ? ?Physical Exam ?BP (!) 124/93   Pulse 85   Temp 97.7 ?F (36.5 ?C) (Oral)   Resp 20   Ht 5\' 7"  (1.702 m)   Wt 72.6 kg   SpO2 100%   BMI 25.06 kg/m?  ? ?Physical Exam ?Vitals and nursing note reviewed.  ?HENT:  ?   Head: Normocephalic.  ?   Nose: Nose normal.  ?Eyes:  ?   Extraocular Movements: Extraocular movements intact.  ?Pulmonary:  ?   Effort: Pulmonary effort is normal.  ?Musculoskeletal:     ?   General: Normal range of motion.  ?   Cervical back: Neck supple.  ?   Comments: Numerous chronic appearing skin wounds from picking. There is a large shallow based ulceration on the R posterior neck without purulence, foul odor or surrounding erythema. See photo below.   ?Skin: ?   Findings: No rash (on exposed skin).  ?Neurological:  ?   Mental Status: She is alert and oriented to person, place, and time.  ?Psychiatric:     ?   Mood and  Affect: Mood normal.  ? ? ? ?ED Results / Procedures / Treatments   ?EKG ?None ? ?Procedures ?Procedures ? ?Medications Ordered in the ED ?Medications - No data to display ? ?Initial Impression and Plan ? Patient here with chronic wound on R neck, no signs of active infection. Advised that I do not have a long term solution for this chronic problem in the ED. Given general wound care advise, encouraged to stop picking at her skin and to try to establish with wound care doctors for long term management.  ? ?ED Course  ? ?  ? ? ?MDM Rules/Calculators/A&P ?Medical Decision Making ?Problems Addressed: ?Chronic wound of head: chronic illness or injury ? ? ? ?Final Clinical Impression(s) / ED Diagnoses ?Final diagnoses:  ?Chronic wound of head  ? ? ?Rx / DC Orders ?ED Discharge Orders   ? ? None  ? ?  ? ?  ? , MD ?05/20/21 [phone] ? ?

## 2021-05-20 NOTE — ED Triage Notes (Addendum)
Pt arrives to ED re: recurrent neck infection- states has recently completed PO Doxycycline- states site appearance improving however site remains tender; subjective report of recent fever however pt states she didn't have thermometer to confirm - pt also c/o generalized skin sores to scalp,  L ear, BUE, thorax region.  ?

## 2021-05-30 ENCOUNTER — Encounter (HOSPITAL_BASED_OUTPATIENT_CLINIC_OR_DEPARTMENT_OTHER): Payer: Self-pay | Attending: Internal Medicine | Admitting: Physician Assistant

## 2021-05-30 DIAGNOSIS — F17200 Nicotine dependence, unspecified, uncomplicated: Secondary | ICD-10-CM | POA: Insufficient documentation

## 2021-05-30 DIAGNOSIS — L0211 Cutaneous abscess of neck: Secondary | ICD-10-CM | POA: Insufficient documentation

## 2021-05-30 DIAGNOSIS — L98492 Non-pressure chronic ulcer of skin of other sites with fat layer exposed: Secondary | ICD-10-CM | POA: Insufficient documentation

## 2021-05-31 NOTE — Progress Notes (Addendum)
SKYAH, LONGCORE ([id number]) Visit Report for 05/30/2021 Allergy List Details Patient Name: Date of Service: LEOTTA, SYFRETT 05/30/2021 8:00 A M Medical Record Number: [id number] Patient Account Number: [id number] Date of Birth/Sex: Treating RN: 10-27-1985 (36 y.o. Benjaman Lobe Primary Care Ambur Province: PCP, NO Other Clinician: Referring Kaleyah Labreck: Treating Lua Feng/Extender: Sharalyn Ink in Treatment: 0 Allergies Active Allergies No Known Allergies Allergy Notes Electronic Signature(s) Signed: 05/31/2021 5:27:50 PM By: Rhae Hammock RN Entered By: Rhae Hammock on 05/30/2021 08:29:16 -------------------------------------------------------------------------------- Arrival Information Details Patient Name: Date of Service: BA TES, [place] 05/30/2021 8:00 A M Medical Record Number: [id number] Patient Account Number: [id number] Date of Birth/Sex: Treating RN: April 13, 1985 (36 y.o. Tonita Phoenix, Lauren Primary Care Shantel Wesely: PCP, NO Other Clinician: Referring Akeiba Axelson: Treating Bryanah Sidell/Extender: Sharalyn Ink in Treatment: 0 Visit Information Patient Arrived: Ambulatory Arrival Time: 08:27 Accompanied By: self Transfer Assistance: None Patient Identification Verified: Yes Secondary Verification Process Completed: Yes Patient Requires Transmission-Based Precautions: No Patient Has Alerts: No Electronic Signature(s) Signed: 05/31/2021 5:27:50 PM By: Rhae Hammock RN Entered By: Rhae Hammock on 05/30/2021 08:28:07 -------------------------------------------------------------------------------- Clinic Level of Care Assessment Details Patient Name: Date of Service: Erick, [place] 05/30/2021 8:00 A M Medical Record Number: [id number] Patient Account Number: [id number] Date of Birth/Sex: Treating RN: Nov 07, 1985 (36 y.o. Benjaman Lobe Primary Care Debbi Strandberg: PCP, NO Other Clinician: Referring Izzabelle Bouley: Treating Ascencion Coye/Extender: Sharalyn Ink in Treatment: 0 Clinic Level of Care Assessment Items TOOL 2 Quantity Score X- 1 0 Use when only an EandM is performed on the INITIAL visit ASSESSMENTS - Nursing Assessment / Reassessment X- 1 20 General Physical Exam (combine w/ comprehensive assessment (listed just below) when performed on new pt. evals) X- 1 25 Comprehensive Assessment (HX, ROS, Risk Assessments, Wounds Hx, etc.) ASSESSMENTS - Wound and Skin A ssessment / Reassessment X - Simple Wound Assessment / Reassessment - one wound 1 5 []  - 0 Complex Wound Assessment / Reassessment - multiple wounds []  - 0 Dermatologic / Skin Assessment (not related to wound area) ASSESSMENTS - Ostomy and/or Continence Assessment and Care []  - 0 Incontinence Assessment and Management []  - 0 Ostomy Care Assessment and Management (repouching, etc.) PROCESS - Coordination of Care X - Simple Patient / Family Education for ongoing care 1 15 []  - 0 Complex (extensive) Patient / Family Education for ongoing care X- 1 10 Staff obtains Programmer, systems, Records, T Results / Process Orders est []  - 0 Staff telephones HHA, Nursing Homes / Clarify orders / etc []  - 0 Routine Transfer to another Facility (non-emergent condition) []  - 0 Routine Hospital Admission (non-emergent condition) X- 1 15 New Admissions / Biomedical engineer / Ordering NPWT Apligraf, etc. , []  - 0 Emergency Hospital Admission (emergent condition) X- 1 10 Simple Discharge Coordination []  - 0 Complex (extensive) Discharge Coordination PROCESS - Special Needs []  - 0 Pediatric / Minor Patient Management []  - 0 Isolation Patient Management []  - 0 Hearing / Language / Visual special needs []  - 0 Assessment of Community assistance (transportation, D/C planning, etc.) []  - 0 Additional assistance / Altered mentation []  - 0 Support Surface(s) Assessment (bed, cushion, seat, etc.) INTERVENTIONS - Wound Cleansing / Measurement X- 1 5 Wound Imaging  (photographs - any number of wounds) []  - 0 Wound Tracing (instead of photographs) X- 1 5 Simple Wound Measurement - one wound []  - 0 Complex Wound Measurement - multiple wounds X- 1 5 Simple Wound Cleansing - one wound []  - 0 Complex Wound Cleansing - multiple wounds  INTERVENTIONS - Wound Dressings X - Small Wound Dressing one or multiple wounds 1 10 []  - 0 Medium Wound Dressing one or multiple wounds []  - 0 Large Wound Dressing one or multiple wounds []  - 0 Application of Medications - injection INTERVENTIONS - Miscellaneous []  - 0 External ear exam []  - 0 Specimen Collection (cultures, biopsies, blood, body fluids, etc.) []  - 0 Specimen(s) / Culture(s) sent or taken to Lab for analysis []  - 0 Patient Transfer (multiple staff / Harrel Lemon Lift / Similar devices) []  - 0 Simple Staple / Suture removal (25 or less) []  - 0 Complex Staple / Suture removal (26 or more) []  - 0 Hypo / Hyperglycemic Management (close monitor of Blood Glucose) []  - 0 Ankle / Brachial Index (ABI) - do not check if billed separately Has the patient been seen at the hospital within the last three years: Yes Total Score: 125 Level Of Care: New/Established - Level 4 Electronic Signature(s) Signed: 05/31/2021 5:27:50 PM By: Rhae Hammock RN Entered By: Rhae Hammock on 05/30/2021 09:53:15 -------------------------------------------------------------------------------- Encounter Discharge Information Details Patient Name: Date of Service: BA TES, [place] 05/30/2021 8:00 A M Medical Record Number: [id number] Patient Account Number: [id number] Date of Birth/Sex: Treating RN: Jul 10, 1985 (36 y.o. Benjaman Lobe Primary Care Marqus Macphee: PCP, NO Other Clinician: Referring Casimir Barcellos: Treating Meghen Akopyan/Extender: Sharalyn Ink in Treatment: 0 Encounter Discharge Information Items Discharge Condition: Stable Ambulatory Status: Ambulatory Discharge Destination: Home Transportation: Private  Auto Accompanied By: self Schedule Follow-up Appointment: Yes Clinical Summary of Care: Patient Declined Electronic Signature(s) Signed: 05/31/2021 5:27:50 PM By: Rhae Hammock RN Entered By: Rhae Hammock on 05/30/2021 09:54:10 -------------------------------------------------------------------------------- Lower Extremity Assessment Details Patient Name: Date of Service: Eino Farber, [place] 05/30/2021 8:00 A M Medical Record Number: [id number] Patient Account Number: [id number] Date of Birth/Sex: Treating RN: May 05, 1985 (36 y.o. Benjaman Lobe Primary Care Arien Morine: PCP, NO Other Clinician: Referring Tallulah Hosman: Treating Leightyn Cina/Extender: Sharalyn Ink in Treatment: 0 Electronic Signature(s) Signed: 05/31/2021 5:27:50 PM By: Rhae Hammock RN Signed: 05/31/2021 5:27:50 PM By: Rhae Hammock RN Entered By: Rhae Hammock on 05/30/2021 08:33:08 -------------------------------------------------------------------------------- [place] Details Patient Name: Date of Service: Eino Farber, [place] 05/30/2021 8:00 A M Medical Record Number: [id number] Patient Account Number: [id number] Date of Birth/Sex: Treating RN: November 01, 1985 (36 y.o. Benjaman Lobe Primary Care Darin Arndt: PCP, NO Other Clinician: Referring Areon Cocuzza: Treating Nathanyel Defenbaugh/Extender: Sharalyn Ink in Treatment: 0 Active Inactive Electronic Signature(s) Signed: 07/02/2021 3:58:07 PM By: Rhae Hammock RN Previous Signature: 05/31/2021 5:27:50 PM Version By: Rhae Hammock RN Entered By: Rhae Hammock on 06/14/2021 10:00:10 -------------------------------------------------------------------------------- Pain Assessment Details Patient Name: Date of Service: Eino Farber, [place] 05/30/2021 8:00 A M Medical Record Number: [id number] Patient Account Number: [id number] Date of Birth/Sex: Treating RN: Oct 05, 1985 (36 y.o. Benjaman Lobe Primary Care Eulalah Rupert:  PCP, NO Other Clinician: Referring Donzella Carrol: Treating Rykar Lebleu/Extender: Sharalyn Ink in Treatment: 0 Active Problems Location of Pain Severity and Description of Pain Patient Has Paino Yes Site Locations Pain Location: Generalized Pain, Pain in Ulcers With Dressing Change: Yes Duration of the Pain. Constant / Intermittento Constant Rate the pain. Current Pain Level: 7 Worst Pain Level: 10 Least Pain Level: 0 Tolerable Pain Level: 7 Character of Pain Describe the Pain: Aching Pain Management and Medication Current Pain Management: Medication: No Cold Application: No Rest: No Massage: No Activity: No T.E.N.S.: No Heat Application: No Leg drop or elevation: No Is the Current Pain Management Adequate: Adequate How does your wound impact your activities of  daily livingo Sleep: No Bathing: No Appetite: No Relationship With Others: No Bladder Continence: No Emotions: No Bowel Continence: No Work: No Toileting: No Drive: No Dressing: No Hobbies: No Electronic Signature(s) Signed: 05/31/2021 5:27:50 PM By: Rhae Hammock RN Entered By: Rhae Hammock on 05/30/2021 08:33:38 -------------------------------------------------------------------------------- Patient/Caregiver Education Details Patient Name: Date of Service: Eino Farber, [place] 4/19/2023andnbsp8:00 [place] Record Number: [id number] Patient Account Number: [id number] Date of Birth/Gender: Treating RN: [birth year] (36 y.o. Benjaman Lobe Primary Care Physician: PCP, NO Other Clinician: Referring Physician: Treating Physician/Extender: Sharalyn Ink in Treatment: 0 Education Assessment Education Provided To: Patient Education Topics Provided Basic Hygiene: Methods: Explain/Verbal Responses: Reinforcements needed, State content correctly Smoking and Wound Healing: Methods: Explain/Verbal Responses: Reinforcements needed, State content correctly [place]: o Methods: Explain/Verbal Responses: Reinforcements needed, State content correctly Wound/Skin Impairment: Methods: Explain/Verbal Responses: Reinforcements needed, State content correctly Electronic Signature(s) Signed: 05/31/2021 5:27:50 PM By: Rhae Hammock RN Entered By: Rhae Hammock on 05/30/2021 09:22:34 -------------------------------------------------------------------------------- Wound Assessment Details Patient Name: Date of Service: BA TES, [place] 05/30/2021 8:00 A M Medical Record Number: [id number] Patient Account Number: [id number] Date of Birth/Sex: Treating RN: [birth year] (36 y.o. Tonita Phoenix, Lauren Primary Care Tianah Lonardo: PCP, NO Other Clinician: Referring Naira Standiford: Treating Iyanni Hepp/Extender: Sharalyn Ink in Treatment: 0 Wound Status Wound Number: 1 Primary Etiology: Abscess Wound Location: Neck Wound Status: Open Wounding Event: Blister Date Acquired: 05/31/2018 Weeks Of Treatment: 0 Clustered Wound: No Photos Wound Measurements Length: (cm) 6 Width: (cm) 9 Depth: (cm) 0.1 Area: (cm) 42.412 Volume: (cm) 4.241 % Reduction in Area: 0% % Reduction in Volume: 0% Epithelialization: Small (1-33%) Tunneling: No Undermining: No Wound Description Classification: Full Thickness With Exposed Support Structures Wound Margin: Distinct, outline attached Exudate Amount: Medium Exudate Type: Serosanguineous Exudate Color: red, brown Foul Odor After Cleansing: No Slough/Fibrino Yes Wound Bed Granulation Amount: Large (67-100%) Exposed Structure Granulation Quality: Red, Pink Fascia Exposed: No Necrotic Amount: Small (1-33%) Fat Layer (Subcutaneous Tissue) Exposed: Yes Necrotic Quality: Adherent Slough Tendon Exposed: No Muscle Exposed: No Joint Exposed: No Bone Exposed: No Electronic Signature(s) Signed: 05/31/2021 5:27:50 PM By: Rhae Hammock RN Entered By: Rhae Hammock on 05/30/2021  09:24:24 -------------------------------------------------------------------------------- Vitals Details Patient Name: Date of Service: BA TES, [place] 05/30/2021 8:00 A M Medical Record Number: [id number] Patient Account Number: [id number] Date of Birth/Sex: Treating RN: [birth year] (36 y.o. Tonita Phoenix, Lauren Primary Care Elizjah Noblet: PCP, NO Other Clinician: Referring Marcelle Hepner: Treating Bryar Dahms/Extender: Sharalyn Ink in Treatment: 0 Vital Signs Time Taken: 08:28 Temperature (F): 98.7 Height (in): 68 Pulse (bpm): 111 Source: Stated Respiratory Rate (breaths/min): 17 Weight (lbs): 170 Blood Pressure (mmHg): 151/98 Source: Stated Reference Range: 80 - 120 mg / dl Body Mass Index (BMI): 25.8 Electronic Signature(s) Signed: 05/31/2021 5:27:50 PM By: Rhae Hammock RN Entered By: Rhae Hammock on 05/30/2021 08:29:04

## 2021-05-31 NOTE — Progress Notes (Signed)
SKYLYN, SLEZAK ([id number]) ?Visit Report for 05/30/2021 ?Chief Complaint Document Details ?Patient Name: Date of Service: ?Leslie Dyer, Leslie Dyer 05/30/2021 8:00 A M ?Medical Record Number: [id number] ?Patient Account Number: [id number] ?Date of Birth/Sex: Treating RN: ?05-Jul-1985 (36 y.o. F) ?Primary Care Provider: PCP, NO Other Clinician: ?Referring Provider: ?Treating Provider/Extender: Lenda Kelp ?Weeks in Treatment: 0 ?Information Obtained from: Patient ?Chief Complaint ?Neck abscess/skin lesion ?Electronic Signature(s) ?Signed: 05/30/2021 9:14:35 AM By: Lenda Kelp PA-C ?Entered By: Lenda Kelp on 05/30/2021 09:14:35 ?-------------------------------------------------------------------------------- ?HPI Details ?Patient Name: Date of Service: ?Leslie Dyer, Leslie Dyer 05/30/2021 8:00 A M ?Medical Record Number: [id number] ?Patient Account Number: [id number] ?Date of Birth/Sex: Treating RN: ?December 15, 1985 (36 y.o. F) ?Primary Care Provider: PCP, NO Other Clinician: ?Referring Provider: ?Treating Provider/Extender: Lenda Kelp ?Weeks in Treatment: 0 ?History of Present Illness ?HPI Description: 05-30-2021 upon evaluation today patient presents for initial inspection here in the clinic concerning an area over her right lateral/posterior ?neck that has been present for about 3 years. It sounds like she has had a lot done although a lot of the records are from when she was in prison and to be ?honest she does not have any access to those. She does tell me that she had debridements that time she is also had skin grafting. 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 She is even been in the hospital recently where they treated her with vancomycin. Nonetheless the good news is right now I do not see any ?signs of active infection which is good there is actually some signs of new epithelial growth happening as well. I really think there is a lot of scar tissue here ?and there is some hypergranulation it is very tender to  touch. I do believe that she may benefit from [facility] 1 barrier to what we are doing right now is ?going to be supplies as she does not have insurance. ?Electronic Signature(s) ?Signed: 05/30/2021 9:30:52 AM By: Lenda Kelp PA-C ?Entered By: Lenda Kelp on 05/30/2021 09:30:52 ?-------------------------------------------------------------------------------- ?Physical Exam Details ?Patient Name: Date of Service: ?Leslie Dyer, Leslie Dyer 05/30/2021 8:00 A M ?Medical Record Number: [id number] ?Patient Account Number: [id number] ?Date of Birth/Sex: Treating RN: ?[birth year] (36 y.o. F) ?Primary Care Provider: PCP, NO Other Clinician: ?Referring Provider: ?Treating Provider/Extender: Lenda Kelp ?Weeks in Treatment: 0 ?Constitutional ?patient is hypertensive.. pulse regular and within target range for patient.Marland Kitchen respirations regular, non-labored and within target range for patient.Marland Kitchen temperature ?within target range for patient.. Well-nourished and well-hydrated in no acute distress. ?Eyes ?conjunctiva clear no eyelid edema noted. pupils equal round and reactive to light and accommodation. ?Ears, Nose, Mouth, and Throat ?no gross abnormality of ear auricles or external auditory canals. normal hearing noted during conversation. mucus membranes moist. ?Respiratory ?normal breathing without difficulty. ?Musculoskeletal ?normal gait and posture. no significant deformity or arthritic changes, no loss or range of motion, no clubbing. ?Psychiatric ?this patient is able to make decisions and demonstrates good insight into disease process. Alert and Oriented x 3. pleasant and cooperative. ?Notes ?Upon inspection patient's wound bed again showed some signs of hypergranulation fortunately this is not too severe but unfortunately it is going to cause ?some limitations in new epithelial growth. I do believe she would do well with Hydrofera Blue. I think that this is something we could try to see about getting ?some supplies for  her that were donated. She is in agreement with that plan and is very thankful. She actually became tearful. With that being said I think this ?  is probably can be her best option in getting this wound to improve in size. ?Electronic Signature(s) ?Signed: 05/30/2021 9:31:28 AM By: Lenda Kelp PA-C ?Entered By: Lenda Kelp on 05/30/2021 09:31:28 ?-------------------------------------------------------------------------------- ?Physician Orders Details ?Patient Name: Date of Service: ?Leslie Dyer, Leslie Dyer 05/30/2021 8:00 A M ?Medical Record Number: [id number] ?Patient Account Number: [id number] ?Date of Birth/Sex: Treating RN: ?[birth year] (36 y.o.  Ardis Rowan, Lauren ?Primary Care Provider: PCP, NO Other Clinician: ?Referring Provider: ?Treating Provider/Extender: Lenda Kelp ?Weeks in Treatment: 0 ?Verbal / Phone Orders: No ?Diagnosis Coding ?ICD-10 Coding ?Code Description ?L02.11 Cutaneous abscess of neck ?L98.492 Non-pressure chronic ulcer of skin of other sites with fat layer exposed ?Follow-up Appointments ?ppointment in 1 week. Allen Derry, PA and [place] Room # 9 ?Return A ?Bathing/ Shower/ Hygiene ?May shower with protection but do not get wound dressing(s) wet. ?Wound Treatment ?Wound #1 - Neck ?Cleanser: Soap and Water 1 x Per Day/15 Days ?Discharge Instructions: May shower and wash wound with dial antibacterial soap and water prior to dressing change. ?Cleanser: Wound Cleanser 1 x Per Day/15 Days ?Discharge Instructions: Cleanse the wound with wound cleanser prior to applying a clean dressing using gauze sponges, not tissue or cotton balls. ?Peri-Wound Care: Skin Prep 1 x Per Day/15 Days ?Discharge Instructions: Use skin prep as directed ?Prim Dressing: Hydrofera Blue Ready Foam, 4x5 in 1 x Per Day/15 Days ?ary ?Discharge Instructions: Apply to wound bed as instructed ?Secondary Dressing: ABD Pad, 5x9 1 x Per Day/15 Days ?Discharge Instructions: Apply over primary dressing as directed. ?Secured With:  71M Medipore H Soft Cloth Surgical T ape, 4 x 10 (in/yd) 1 x Per Day/15 Days ?Discharge Instructions: Secure with tape as directed. ?Electronic Signature(s) ?Signed: 05/30/2021 4:47:54 PM By: Lenda Kelp PA-C ?Signed: 05/31/2021 5:27:50 PM By: Fonnie Mu RN ?Entered By: Fonnie Mu on 05/30/2021 09:27:51 ?-------------------------------------------------------------------------------- ?Problem List Details ?Patient Name: ?Date of Service: ?Leslie Dyer, Leslie Dyer 05/30/2021 8:00 A M ?Medical Record Number: [id number] ?Patient Account Number: [id number] ?Date of Birth/Sex: ?Treating RN: ?1986/02/04 (36 y.o. 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 F) ?Primary Care Provider: PCP, NO ?Other Clinician: ?Referring Provider: ?Treating Provider/Extender: Lenda Kelp ?Weeks in Treatment: 0 ?Subjective ?Chief Complaint ?Information obtained from Patient ?Neck abscess/skin lesion ?History of Present Illness (HPI) ?05-30-2021 upon evaluation today patient presents for initial inspection here in the clinic concerning an area over her right lateral/posterior neck  that has been ?present for about 3 years. It sounds like she has had a lot done although a lot of the records are from when she was in prison and to be honest she does not ?have any access to those. She d

## 2021-05-31 NOTE — Progress Notes (Signed)
Leslie Dyer, Leslie Dyer ([id number]) ?Visit Report for 05/30/2021 ?Abuse Risk Screen Details ?Patient Name: Date of Service: ?Leslie Dyer, Leslie Dyer 05/30/2021 8:00 A M ?Medical Record Number: [id number] ?Patient Account Number: [id number] ?Date of Birth/Sex: Treating RN: ?Nov 12, 1985 (36 y.o. Leslie Dyer, Leslie Dyer ?Primary Care Basilia Stuckert: PCP, NO Other Clinician: ?Referring Lawrencia Mauney: ?Treating Darion Milewski/Extender: Worthy Keeler ?Weeks in Treatment: 0 ?Abuse Risk Screen Items ?Answer ?ABUSE RISK SCREEN: ?Has anyone close to you tried to hurt or harm you recentlyo No ?Do you feel uncomfortable with anyone in your familyo No ?Has anyone forced you do things that you didnt want to doo No ?Electronic Signature(s) ?Signed: 05/31/2021 5:27:50 PM By: Rhae Hammock RN ?Entered By: Rhae Hammock on 05/30/2021 08:31:38 ?-------------------------------------------------------------------------------- ?Activities of Daily Living Details ?Patient Name: Date of Service: ?Leslie Dyer, Leslie Dyer 05/30/2021 8:00 A M ?Medical Record Number: [id number] ?Patient Account Number: [id number] ?Date of Birth/Sex: Treating RN: ?April 07, 1985 (36 y.o. Leslie Dyer, Leslie Dyer ?Primary Care Carlean Crowl: PCP, NO Other Clinician: ?Referring Haji Delaine: ?Treating Indi Willhite/Extender: Worthy Keeler ?Weeks in Treatment: 0 ?Activities of Daily Living Items ?Answer ?Activities of Daily Living (Please select one for each item) ?Big Pool ?T Medications ?ake Completely Able ?Use T elephone Completely Able ?Care for Appearance Completely Able ?Use T oilet Completely Able ?Bath / Shower Completely Able ?Dress Self Completely Able ?Feed Self Completely Able ?Walk Completely Able ?Get In / Out Bed Completely Able ?Housework Completely Able ?Prepare Meals Completely Able ?Handle Money Completely Able ?Shop for Self Completely Able ?Electronic Signature(s) ?Signed: 05/31/2021 5:27:50 PM By: Rhae Hammock RN ?Entered By: Rhae Hammock on 05/30/2021  08:32:08 ?-------------------------------------------------------------------------------- ?Education Screening Details ?Patient Name: ?Date of Service: ?Leslie Dyer, Kaelen 05/30/2021 8:00 A M ?Medical Record Number: [id number] ?Patient Account Number: [id number] ?Date of Birth/Sex: ?Treating RN: ?1985/07/27 (36 y.o. Leslie Dyer, Leslie Dyer ?Primary Care Klinton Candelas: PCP, NO ?Other Clinician: ?Referring Janesa Dockery: ?Treating Makayla Confer/Extender: Worthy Keeler ?Weeks in Treatment: 0 ?Primary Learner Assessed: Patient ?Learning Preferences/Education Level/Primary Language ?Learning Preference: Explanation ?Highest Education Level: High School ?Preferred Language: English ?Cognitive Barrier ?Language Barrier: No ?Translator Needed: No ?Memory Deficit: No ?Emotional Barrier: No ?Cultural/Religious Beliefs Affecting Medical Care: No ?Physical Barrier ?Impaired Vision: No ?Impaired Hearing: No ?Decreased Hand dexterity: No ?Knowledge/Comprehension ?Knowledge Level: High ?Comprehension Level: High ?Ability to understand written instructions: High ?Ability to understand verbal instructions: High ?Motivation ?Anxiety Level: Calm ?Cooperation: Cooperative ?Education Importance: Denies Need ?Interest in Health Problems: Asks Questions ?Perception: Coherent ?Willingness to Engage in Self-Management High ?Activities: ?Readiness to Engage in Self-Management High ?Activities: ?Electronic Signature(s) ?Signed: 05/31/2021 5:27:50 PM By: Rhae Hammock RN ?Entered By: Rhae Hammock on 05/30/2021 08:32:28 ?-------------------------------------------------------------------------------- ?Fall Risk Assessment Details ?Patient Name: ?Date of Service: ?Leslie Dyer, Whisper 05/30/2021 8:00 A M ?Medical Record Number: [id number] ?Patient Account Number: [id number] ?Date of Birth/Sex: ?Treating RN: ?1985-03-01 (36 y.o. Leslie Dyer, Leslie Dyer ?Primary Care Jamarie Joplin: PCP, NO ?Other Clinician: ?Referring Yvon Mccord: ?Treating Mallisa [place]/Extender: Worthy Keeler ?Weeks in Treatment: 0 ?Fall Risk Assessment Items ?Have you had 2 or more falls in the last 12 monthso 0 No ?Have you had any fall that resulted in injury in the last 12 monthso 0 No ?FALLS RISK SCREEN ?History of falling - immediate or within 3 months 0 No ?Secondary diagnosis (Do you have 2 or more medical diagnoseso) 0 No ?Ambulatory aid ?None/bed rest/wheelchair/nurse 0 No ?Crutches/cane/walker 0 No ?Furniture 0 No ?Intravenous therapy Access/Saline/Heparin Lock 0 No ?Gait/Transferring ?Normal/ bed rest/ wheelchair 0 No ?Weak (short steps with or without shuffle, stooped but able to lift head while walking,  may seek 0 No ?support from furniture) ?Impaired (short steps with shuffle, may have difficulty arising from chair, head down, impaired 0 No ?balance) ?Mental Status ?Oriented to own ability 0 No ?Electronic Signature(s) ?Signed: 05/31/2021 5:27:50 PM By: Rhae Hammock RN ?Entered By: Rhae Hammock on 05/30/2021 08:32:34 ?-------------------------------------------------------------------------------- ?Foot Assessment Details ?Patient Name: ?Date of Service: ?Leslie Dyer, Leslie Dyer 05/30/2021 8:00 A M ?Medical Record Number: [id number] ?Patient Account Number: [id number] ?Date of Birth/Sex: ?Treating RN: ?09/30/1985 (36 y.o. Leslie Dyer, Leslie Dyer ?Primary Care Linkin Vizzini: PCP, NO ?Other Clinician: ?Referring Natanya Holecek: ?Treating Madline Oesterling/Extender: Worthy Keeler ?Weeks in Treatment: 0 ?Foot Assessment Items ?Site Locations ?+ = Sensation present, - = Sensation absent, C = Callus, U = Ulcer ?R = Redness, W = Warmth, M = Maceration, PU = Pre-ulcerative lesion ?F = Fissure, S = Swelling, D = Dryness ?Assessment ?Right: Left: ?Other Deformity: No No ?Prior Foot Ulcer: No No ?Prior Amputation: No No ?Charcot Joint: No No ?Ambulatory Status: ?Gait: ?Notes ?N/A PT NOT DIABETIC NO LE WOUNDS!!! ?. ?Electronic Signature(s) ?Signed: 05/31/2021 5:27:50 PM By: Rhae Hammock RN ?Entered By: Rhae Hammock on  05/30/2021 08:33:00 ?-------------------------------------------------------------------------------- ?Nutrition Risk Screening Details ?Patient Name: ?Date of Service: ?Leslie Dyer, Leslie Dyer 05/30/2021 8:00 A M ?Medical Record Number: [id number] ?Patient Account Number: [id number] ?Date of Birth/Sex: ?Treating RN: ?July 31, 1985 (36 y.o. Leslie Dyer, Leslie Dyer ?Primary Care Miqueas Whilden: PCP, NO ?Other Clinician: ?Referring Emeterio Balke: ?Treating Yasuo Phimmasone/Extender: Worthy Keeler ?Weeks in Treatment: 0 ?Height (in): 68 ?Weight (lbs): 170 ?Body Mass Index (BMI): 25.8 ?Nutrition Risk Screening Items ?Score Screening ?NUTRITION RISK SCREEN: ?I have an illness or condition that made me change the kind and/or amount of food I eat 0 No ?I eat fewer than two meals per day 0 No ?I eat few fruits and vegetables, or milk products 0 No ?I have three or more drinks of beer, liquor or wine almost every day 0 No ?I have tooth or mouth problems that make it hard for me to eat 0 No ?I don't always have enough money to buy the food I need 0 No ?I eat alone most of the time 0 No ?I take three or more different prescribed or over-the-counter drugs a day 0 No ?Without wanting to, I have lost or gained 10 pounds in the last six months 0 No ?I am not always physically able to shop, cook and/or feed myself 0 No ?Nutrition Protocols ?Good Risk Protocol 0 No interventions needed ?Moderate Risk Protocol ?High Risk Proctocol ?Risk Level: Good Risk ?Score: 0 ?Electronic Signature(s) ?Signed: 05/31/2021 5:27:50 PM By: Rhae Hammock RN ?Entered By: Rhae Hammock on 05/30/2021 08:32:42 ?

## 2021-06-04 ENCOUNTER — Encounter (HOSPITAL_BASED_OUTPATIENT_CLINIC_OR_DEPARTMENT_OTHER): Payer: Self-pay | Admitting: Internal Medicine

## 2021-06-06 ENCOUNTER — Encounter (HOSPITAL_BASED_OUTPATIENT_CLINIC_OR_DEPARTMENT_OTHER): Payer: Self-pay | Admitting: Physician Assistant

## 2021-06-13 ENCOUNTER — Encounter (HOSPITAL_BASED_OUTPATIENT_CLINIC_OR_DEPARTMENT_OTHER): Payer: Self-pay | Attending: Physician Assistant | Admitting: Physician Assistant
